# Patient Record
Sex: Female | Born: 1965 | Race: White | Hispanic: No | Marital: Married | Smoking: Never smoker
Health system: Southern US, Academic
[De-identification: ages and names within clinical notes are randomized; demographics above are authoritative.]

## PROBLEM LIST (undated history)

## (undated) DIAGNOSIS — I10 Essential (primary) hypertension: Secondary | ICD-10-CM

## (undated) HISTORY — PX: HX TONSILLECTOMY: SHX27

## (undated) HISTORY — DX: Essential (primary) hypertension: I10

## (undated) HISTORY — PX: HX DILATION AND CURETTAGE: SHX78

---

## 1999-05-28 ENCOUNTER — Observation Stay (HOSPITAL_COMMUNITY): Payer: Self-pay

## 2015-06-11 IMAGING — MG MAMMO DIGITAL SCREENING
1 series · 7 of 7 positions shown · non-contrast
Comparison: None.

------------- REPORT [id number] -------------
HALVORSON, GIRISH

MAMMO DIGITAL SCREENING WITH CAD
Exam:  
Screening digital mammogram with CAD
INDICATION: Annual screening.

[R CC · oblique · right · 7 of 7 slices shown]
[im 1/7]
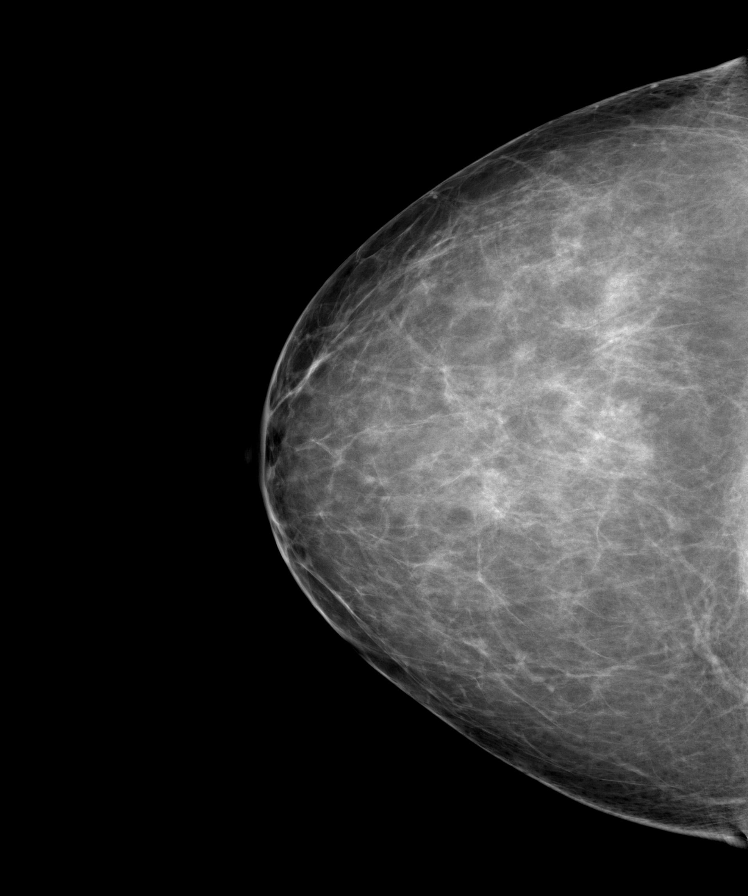
[im 2/7]
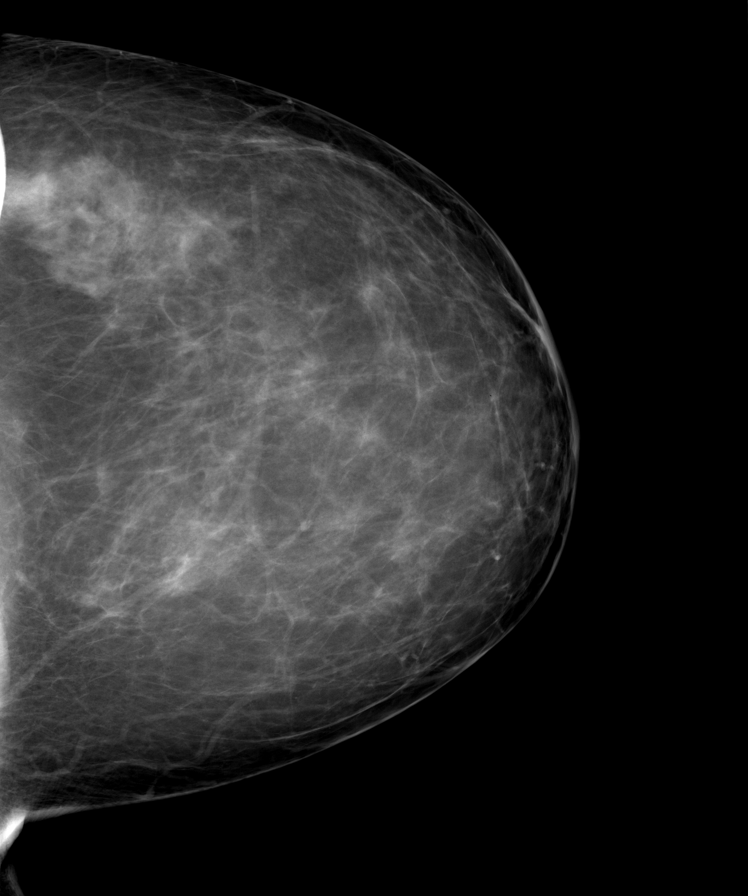
[im 3/7]
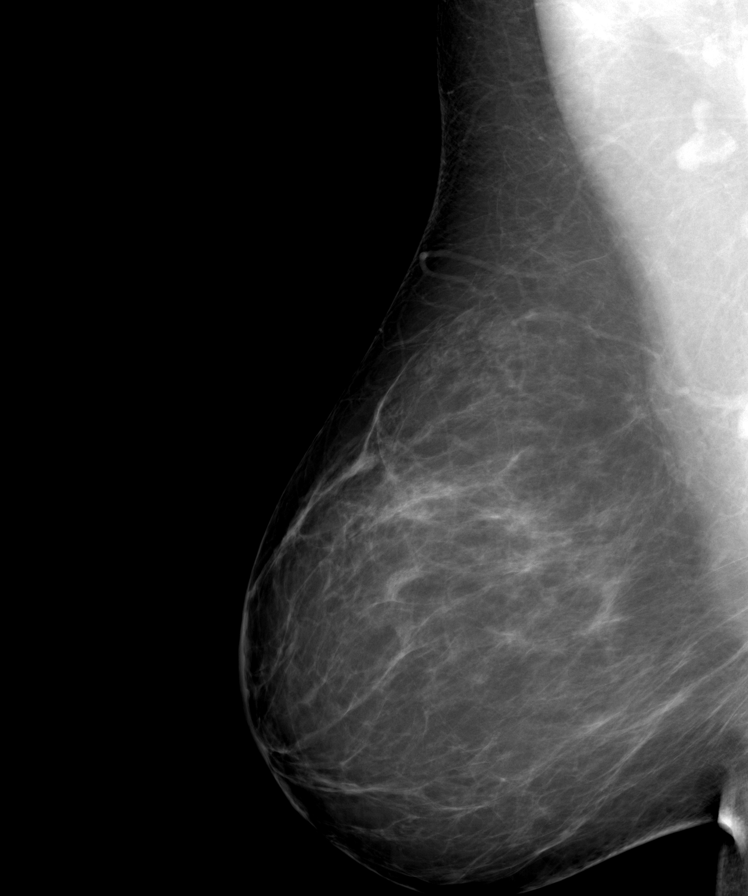
[im 4/7]
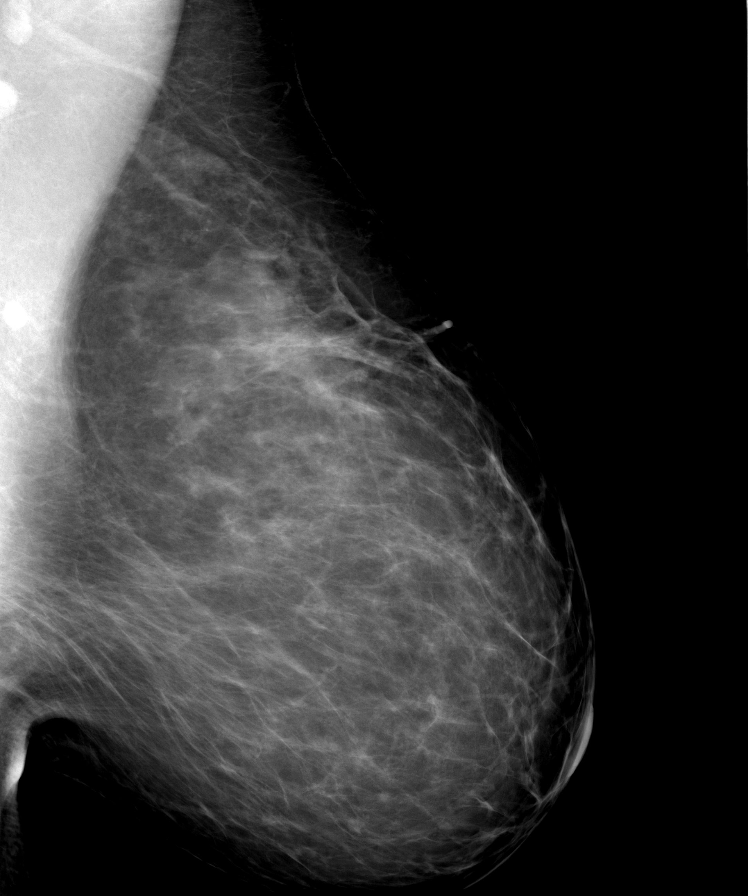
[im 5/7]
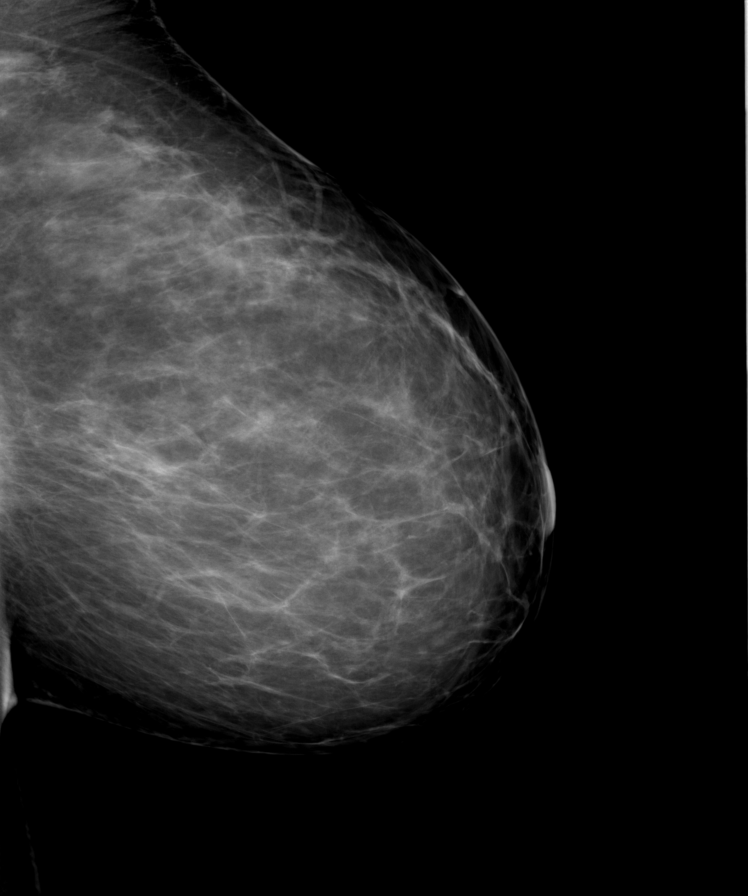
[im 6/7]
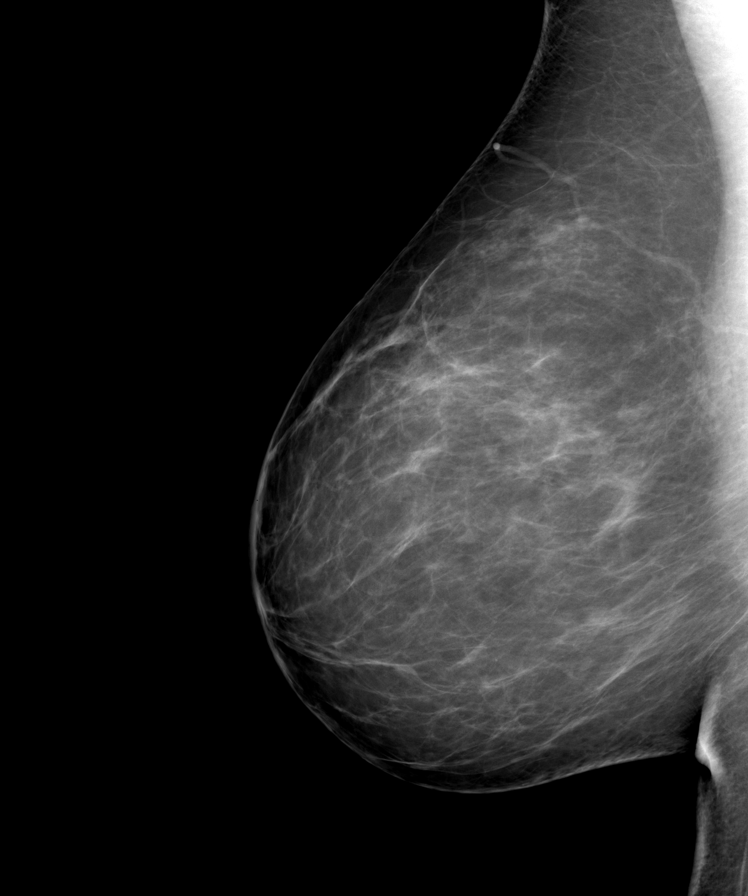
[im 7/7]
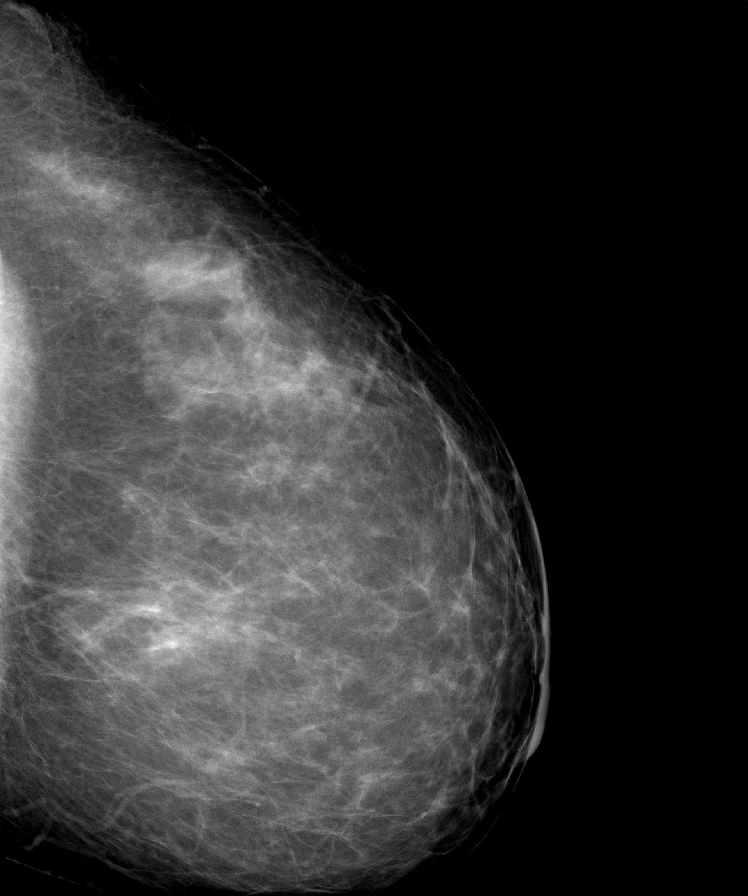

[7 of 7 positions shown; findings below may reference images not displayed]

FINDINGS: There are scattered fibroglandular elements. There is no mass or suspicious cluster of microcalcifications. There is no architectural distortion, skin thickening or nipple retraction.
IMPRESSION: 1.
BIRADS 2-Benign findings. Patient has been added in a reminder system with a
target date for the next screening mammography.
2.
DENSITY CODE   B (Scattered areas of fibroglandular density)
Final Assessment Code:
Bi-Rads 2 

BI-RADS 0
Need additional imaging evaluation
BI-RADS 1
Negative mammogram
BI-RADS 2
Benign finding
BI-RADS 3
Probably benign finding: short-interval follow-up suggested
BI-RADS 4
Suspicious abnormality:  biopsy should be considered
BI-RADS 5
Highly suggestive of malignancy; appropriate action should be taken
BI-RADS 6
Known Biopsy-proven Malignancy  Appropriate action should be taken
NOTE:
In compliance with Federal regulations, the results of this mammogram are being sent to the patient.

------------- REPORT [id number] -------------
[facility]
[address]
Daina Ms.WATANABE, PAXTON:
We wish to report the following on your recent mammography examination. We are sending a report to your referring physician or other health care provider. 
(       Normal/Negative:
No evidence of cancer.
This statement is mandated by the [place], Department of Health.
Your examination was performed by one of our technologists, who are registered radiological technologists and also specially certified in mammography:
___
Parlak, Edaly (M)
___
Dang, Mcalex (M)

Your mammogram was interpreted by our radiologist.

( 
Sofeine Made, M.D.

(Annual Breast Examination by a physician or other health care provider
(Annual Mammography Screening beginning at age 40
(Monthly Breast Self Examination

## 2018-06-27 IMAGING — MG 3D SCREENING MAMMO BIL W/CAD
5 series · 7 of 24 positions shown · non-contrast
Comparison: 02/02/2017

------------- REPORT [id number] -------------
[facility]
[address]
Daina Ms.PARAGH, MIKKIE:
We wish to report the following on your recent mammography examination. We are sending a report to your referring physician or other health care provider. 
(       Normal/Negative:
No evidence of cancer.
This statement is mandated by the [place], Department of Health.
Your examination was performed by one of our technologists, who are registered radiological technologists and also specially certified in mammography:
___
Parlak, Edaly (M)
Dang, Mcalex (M)

Your mammogram was interpreted by our radiologist.
( 
Sofeine Made, M.D.
(Annual Breast Examination by a physician or other health care provider
(Annual Mammography Screening beginning at age 40
(Monthly Breast Self Examination
------------- REPORT [id number] -------------
SURDAL, ODDVAR
EXAM:  3D BILATERAL ANNUAL SCREENING DIGITAL MAMMOGRAM WITH TOMOSYNTHESIS AND CAD
INDICATION: Screening.

[R CC · right · 0.10mm/px · 2 of 2 slices shown]
[im 1/2]
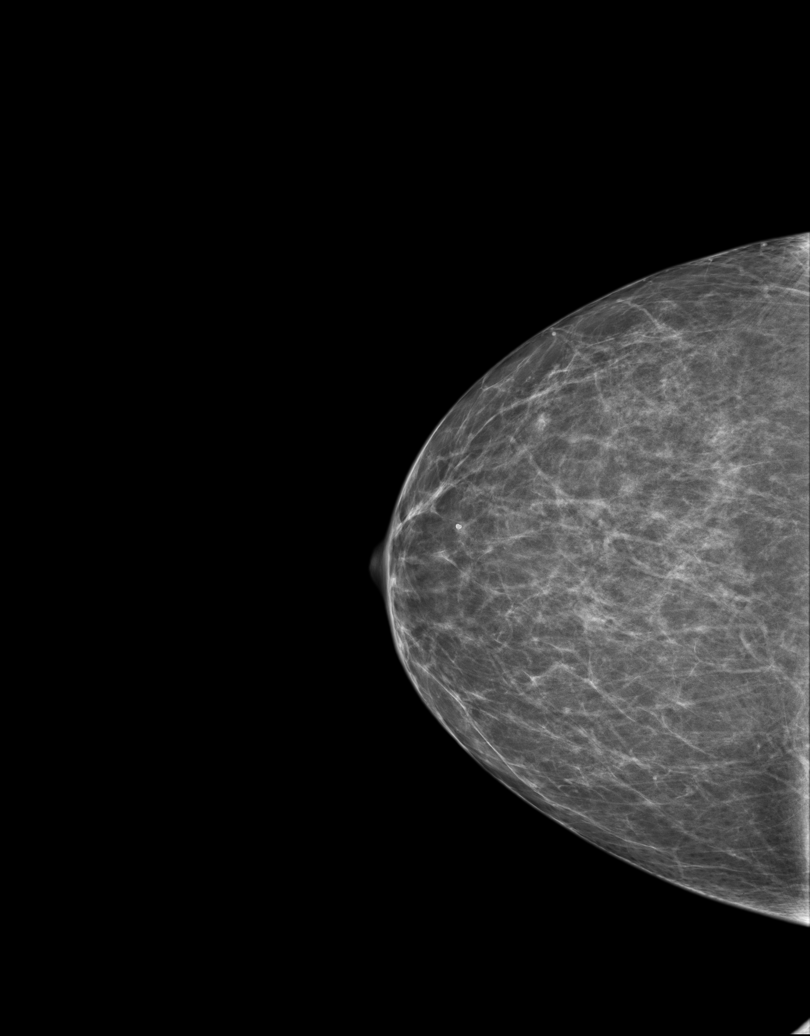
[im 2/2]
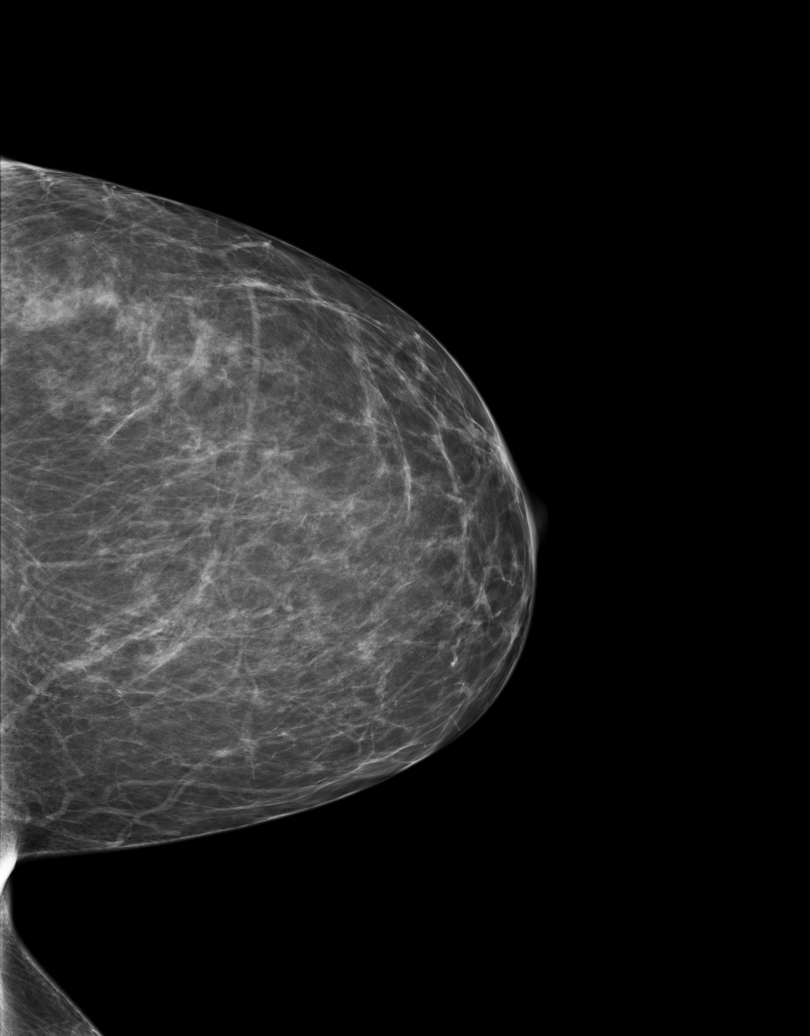

[3D SCREENING MAMMO BIL W/CAD · 2 acquisitions, 2 frames shown (1 of 2)]
[im 1/2]
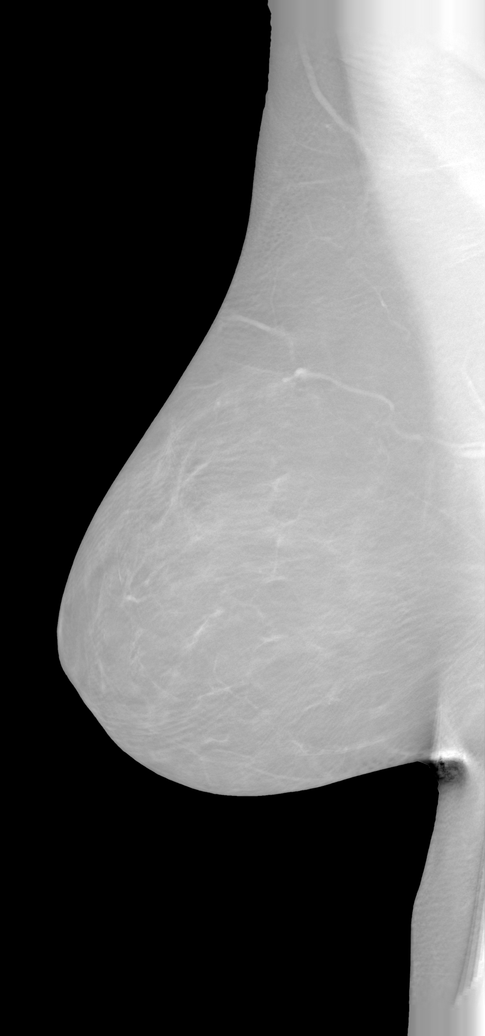
[im 2/2]
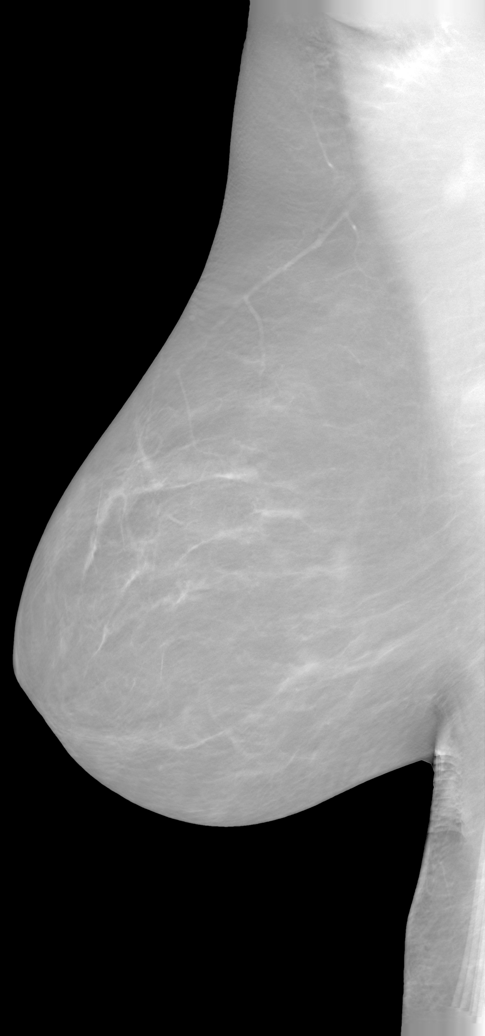

[R]
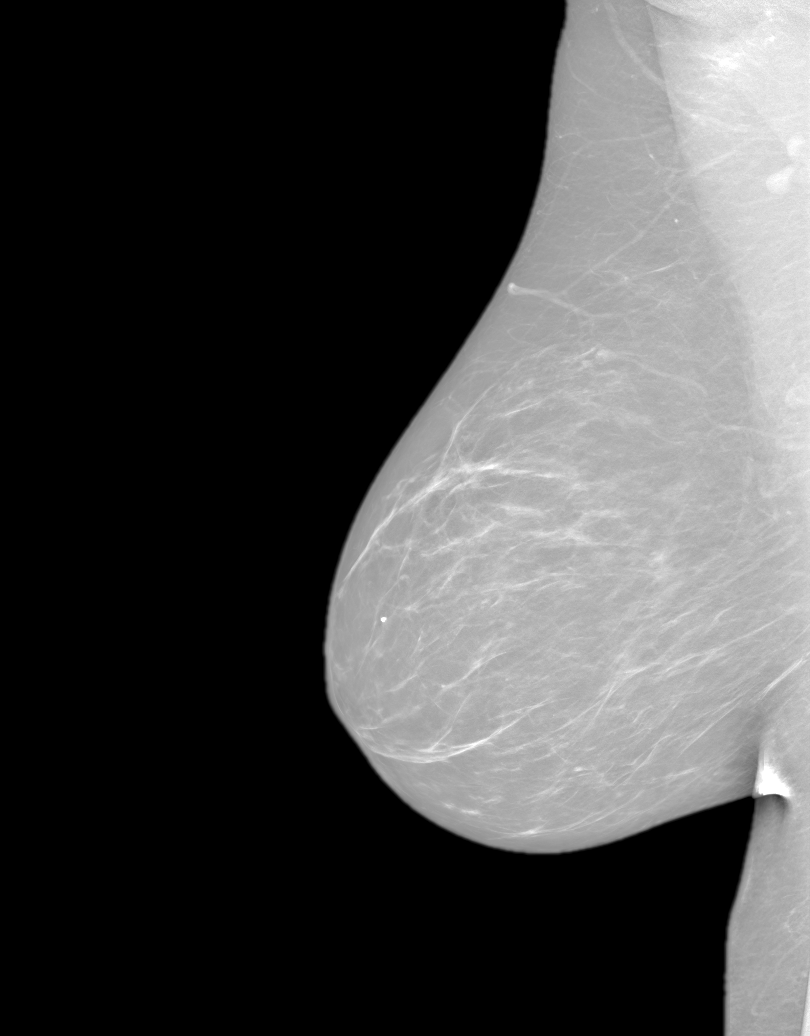

[3D SCREENING MAMMO BIL W/CAD (2 of 2) · tomo slice 14/87.0]
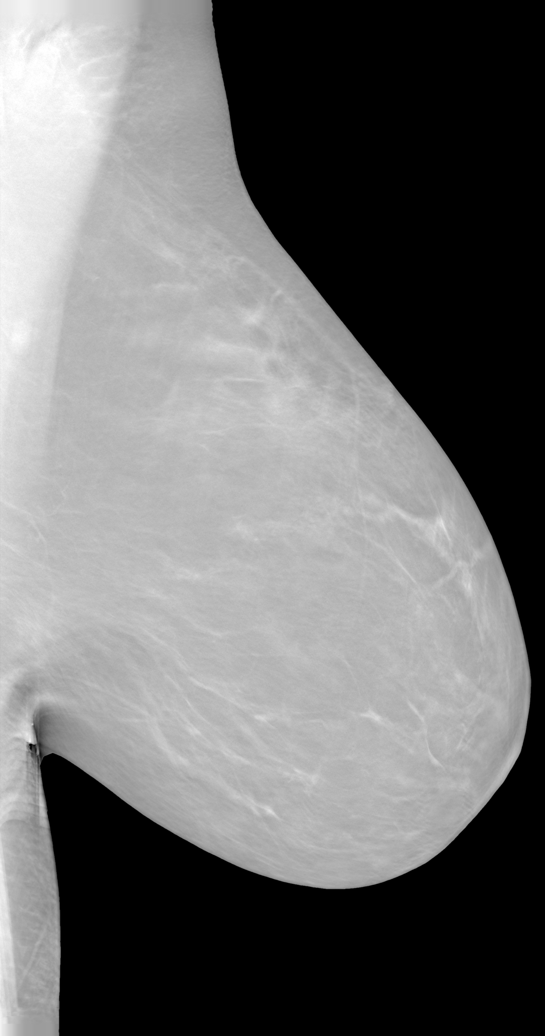

[L]
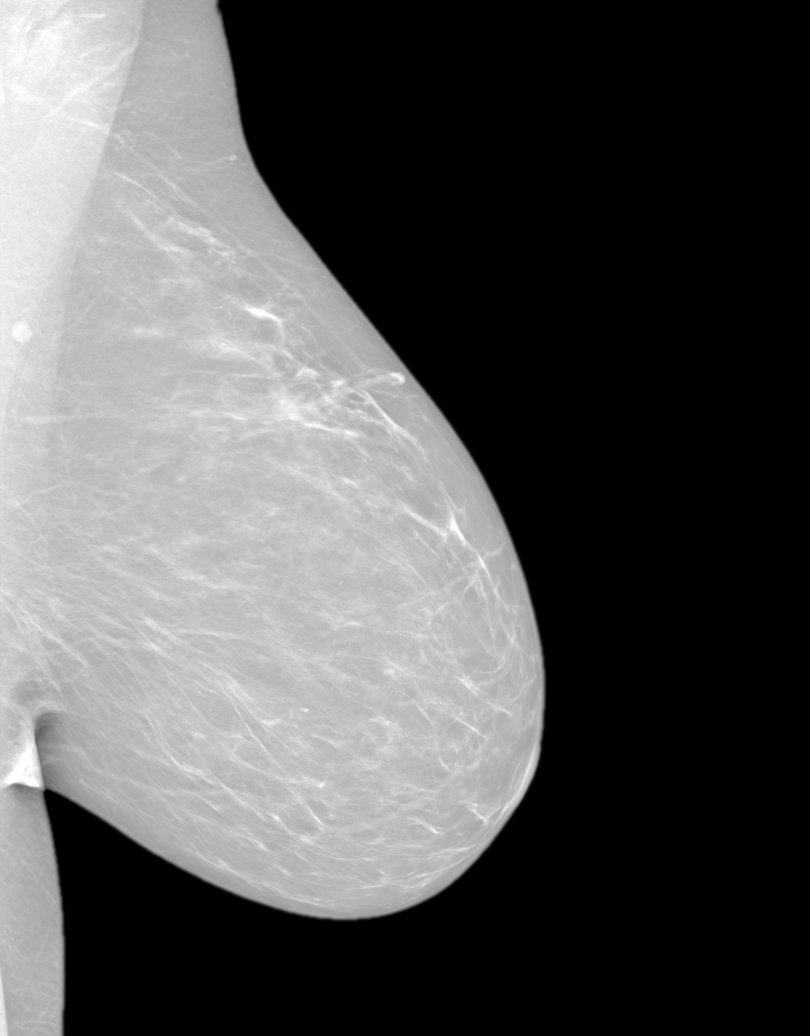

[7 of 24 positions shown; findings below may reference images not displayed]

FINDINGS: There are scattered fibroglandular elements.  There is no mass or suspicious cluster of microcalcifications.   There is no architectural distortion, skin thickening or nipple retraction.
IMPRESSION: 1.  BIRADS 2-Benign findings. Patient has been added in a reminder system with a target date for the next screening mammography.

2.  DENSITY CODE –  B (Scattered areas of fibroglandular density).

Final Assessment Code:

Bi-Rads 2 

BI-RADS 0
Need additional imaging evaluation

BI-RADS 1
Negative mammogram

BI-RADS 2
Benign finding

BI-RADS 3
Probably benign finding: short-interval follow-up suggested

BI-RADS 4
Suspicious abnormality:  biopsy should be considered

BI-RADS 5
Highly suggestive of malignancy; appropriate action should be taken

BI-RADS 6
Known Biopsy-proven Malignancy – Appropriate action should be taken

NOTE:
In compliance with Federal regulations, the results of this mammogram are being sent to the patient.

## 2020-04-29 IMAGING — MG 3D SCREENING MAMMO BIL W/CAD & TOMO
5 series · 8 of 24 positions shown · non-contrast
Comparison: 02/17/2019

------------- REPORT [id number] -------------
[facility]
[address]
Daina Ms.PARAGH, MIKKIE:
We wish to report the following on your recent mammography examination. We are sending a report to your referring physician or other health care provider. 
(       Normal/Negative:
No evidence of cancer.
This statement is mandated by the [place], Department of Health.
Your examination was performed by one of our technologists, who are registered radiological technologists and also specially certified in mammography:
___
Parlak, Edaly (M)
Nepomuceno, Martinez (M)

Your mammogram was interpreted by our radiologist.
( 
Sofeine Made, M.D.
(Annual Breast Examination by a physician or other health care provider
(Annual Mammography Screening beginning at age 40
(Monthly Breast Self Examination
------------- REPORT [id number] -------------
﻿
                         #:
EXAM:  3D BILATERAL ANNUAL SCREENING DIGITAL MAMMOGRAM WITH CAD AND TOMOSYNTHESIS
INDICATION: Screening.

[R]
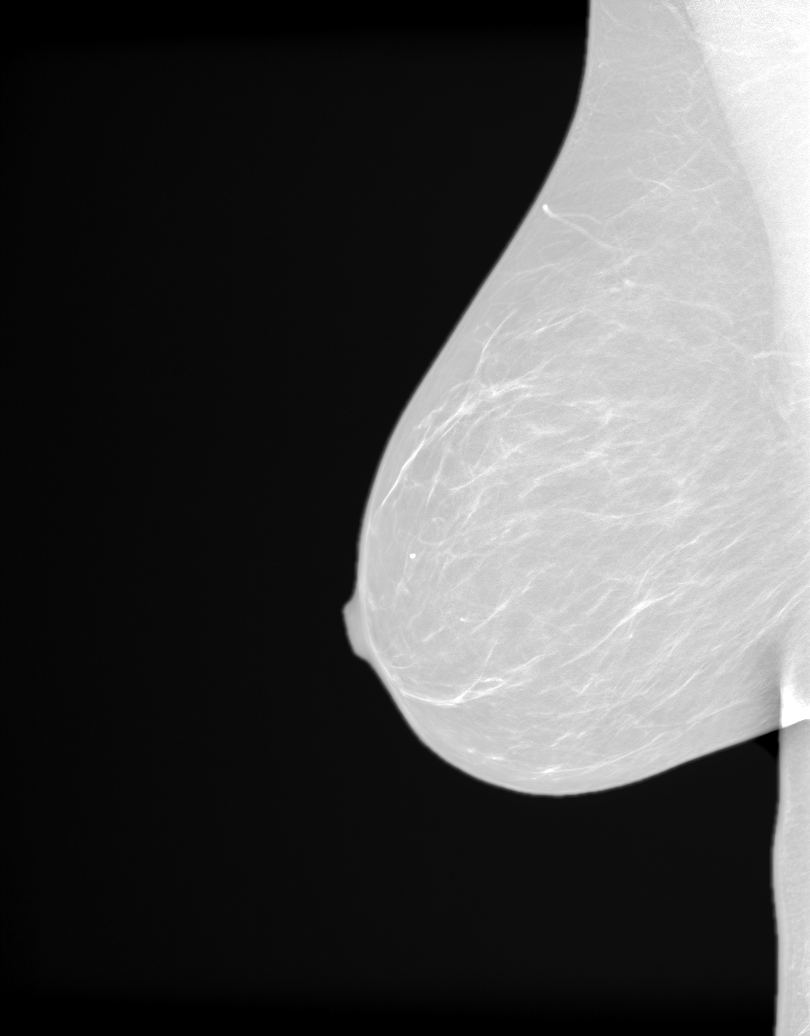

[L]
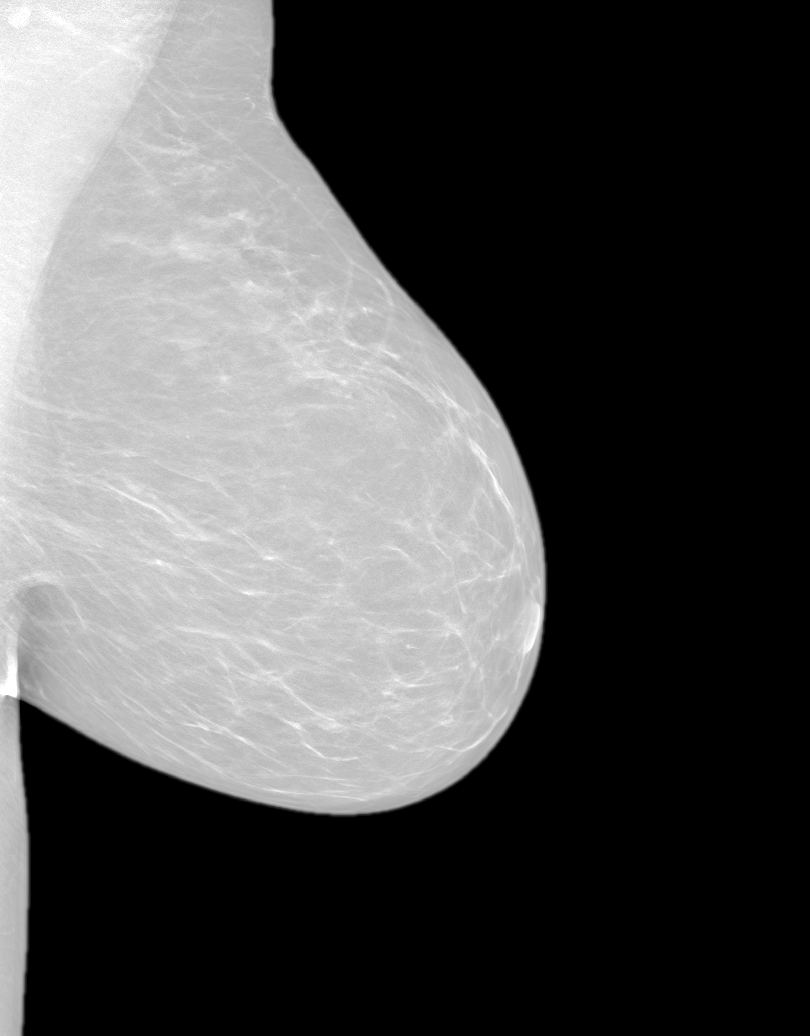

[R CC tomo · right · 2 of 2 slices shown]
[im 1/2]
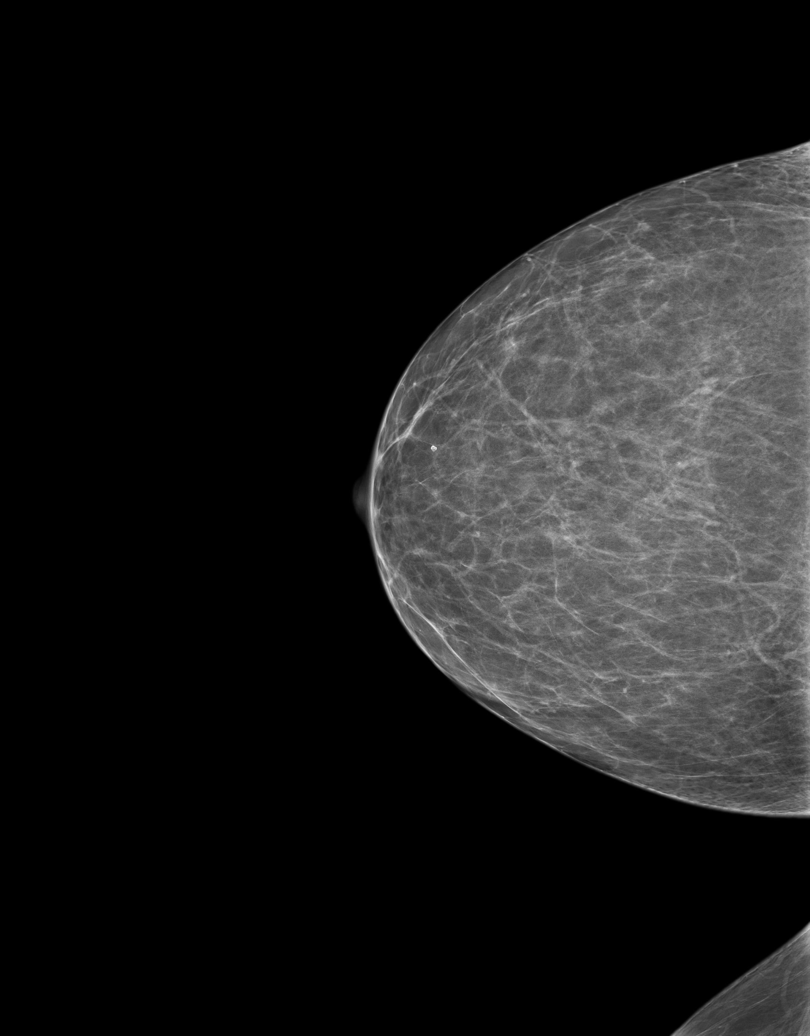
[im 2/2]
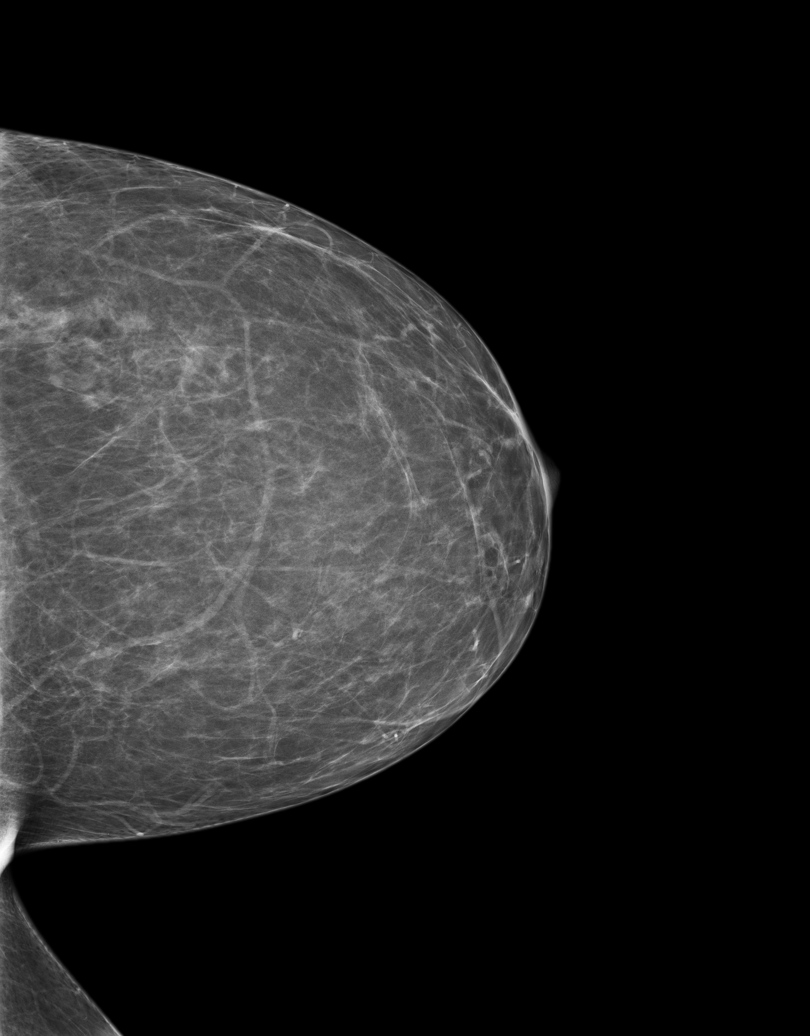

[3D SCREENING MAMMO BIL W/CAD & TOMO tomo · 2 acquisitions, 3 frames shown (1 of 2)]
[im 1/2]
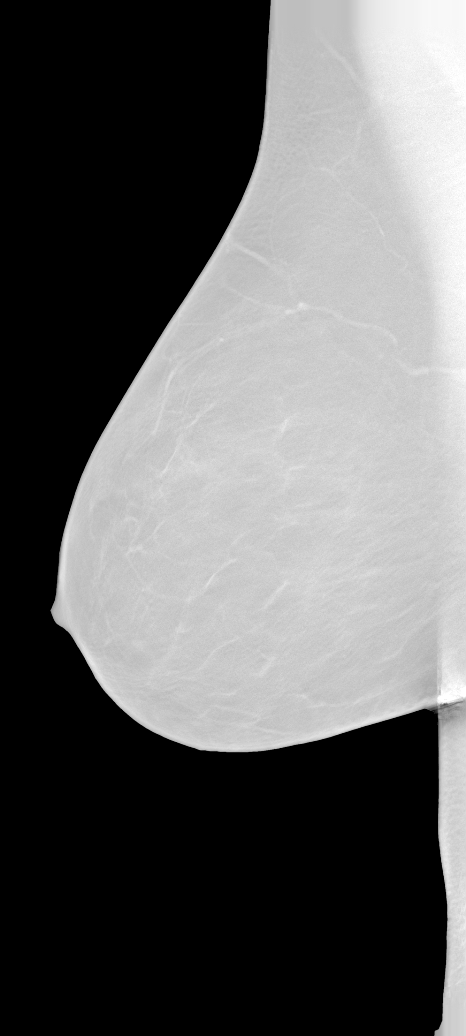
[im 2/2]
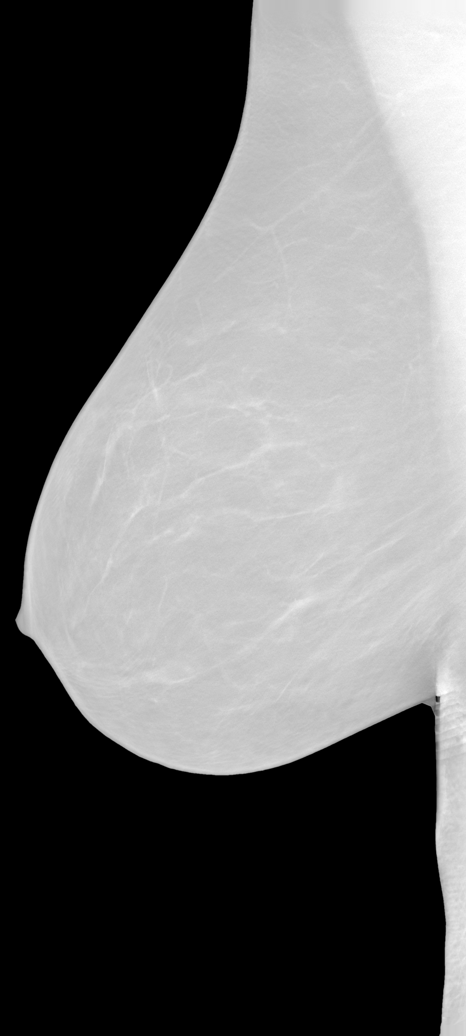
[im 2/2]
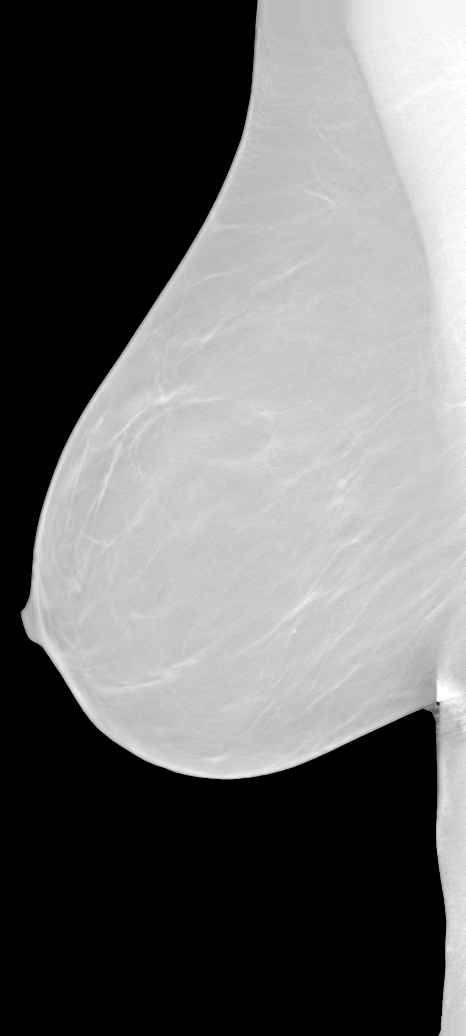

[3D SCREENING MAMMO BIL W/CAD & TOMO tomo (2 of 2) · tomo slice 13/78.0]
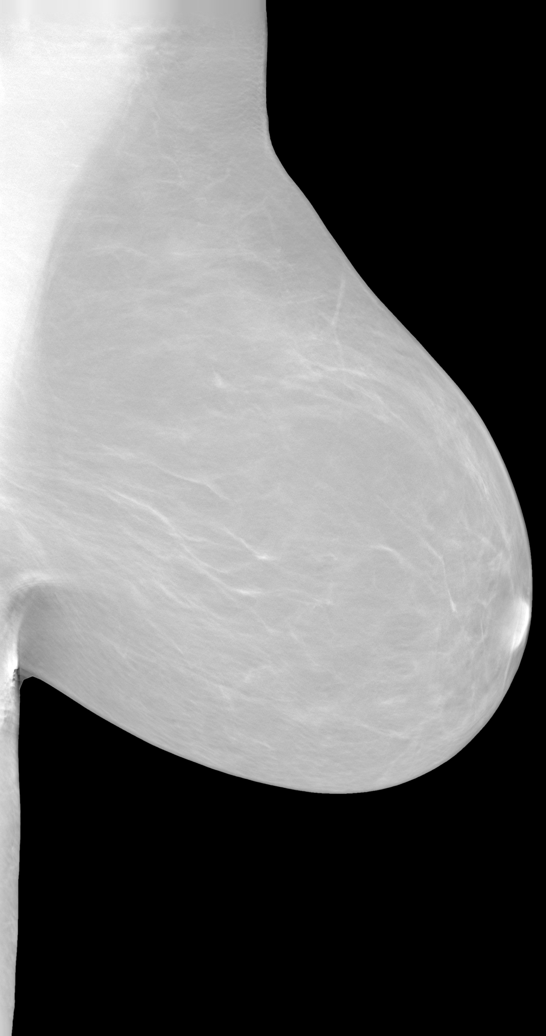

[8 of 24 positions shown; findings below may reference images not displayed]

FINDINGS: There are scattered fibroglandular elements.  There is no mass or suspicious cluster of microcalcifications.   There is no architectural distortion, skin thickening or nipple retraction.
IMPRESSION: 1.  BIRADS 2-Benign findings. Patient has been added in a reminder system with a target date for the next screening mammography.

2.  DENSITY CODE – B (Scattered areas of fibroglandular density). 

Final Assessment Code:

Bi-Rads 2 

BI-RADS 0
Need additional imaging evaluation.

BI-RADS 1
Negative mammogram.

BI-RADS 2
Benign finding.

BI-RADS 3
Probably benign finding; short-interval follow-up suggested.

BI-RADS 4
Suspicious abnormality; biopsy should be considered.

BI-RADS 5
Highly suggestive of malignancy; appropriate action should be taken.

BI-RADS 6
Known biopsy-proven malignancy; appropriate action should be taken.

NOTE:
In compliance with Federal regulations, the results of this mammogram are being sent to the patient.

## 2021-08-04 IMAGING — MG 3D SCREENING MAMMO BIL AND TOMO
5 series · 7 of 24 positions shown · non-contrast
Comparison: Exam dated 08/08/2021, 10/06/2019.

------------- REPORT [id number] -------------
[facility]
[address]
Tiger Ms.FOUSSEYNI, CRIZNA:
We wish to report the following on your recent mammography examination. We are sending a report to your referring physician or other health care provider. 
(       Normal/Negative:
No evidence of cancer.
This statement is mandated by the [place], Department of Health.
Your examination was performed by one of our technologists, who are registered radiological technologists and also specially certified in mammography:
___
Markland, Marjuan (M)

Your mammogram was interpreted by our radiologist.
( 
Collette Sedman, M.D.
(Annual Breast Examination by a physician or other health care provider
(Annual Mammography Screening beginning at age 40
(Monthly Breast Self Examination
------------- REPORT [id number] -------------
﻿
ADDOW, TOM-ARILD
EXAM:  BILATERAL DIGITAL SCREENING MAMMOGRAM WITH 3D TOMOSYNTHESIS
INDICATION: Asymptomatic 55-year-old with family history of breast cancer in her mother.  Lifetime breast cancer risk 17%.

[R]
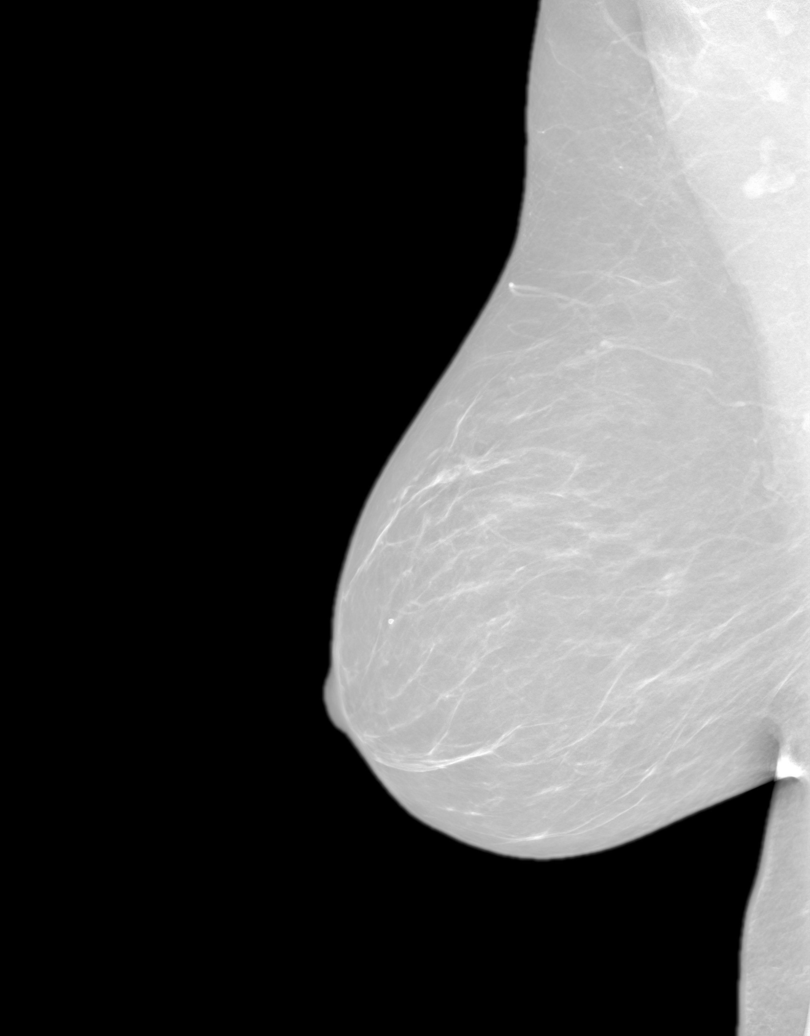

[L]
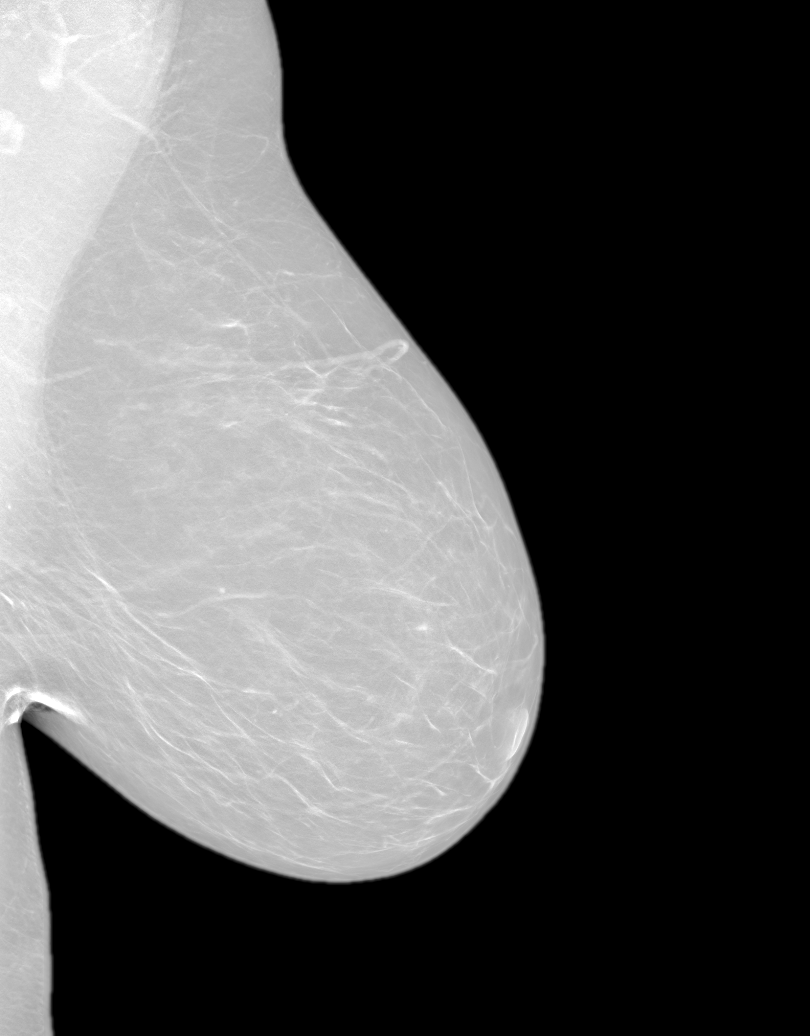

[R CC tomo · right · 0.10mm/px · 2 of 2 slices shown]
[im 1/2]
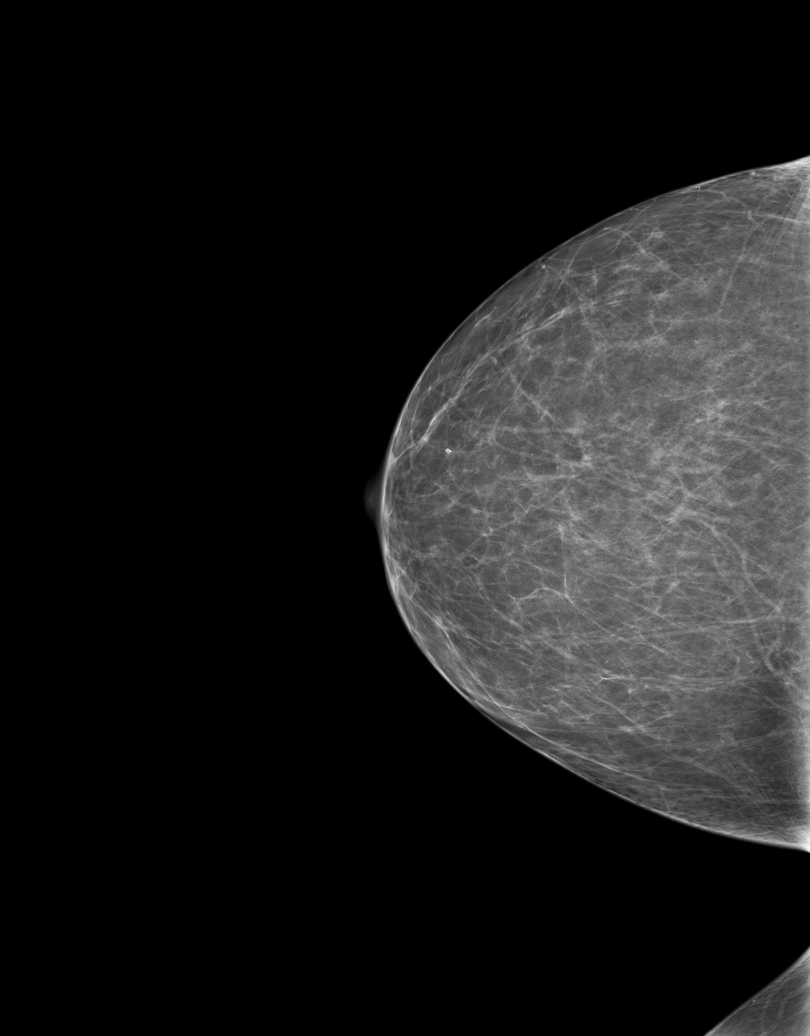
[im 2/2]
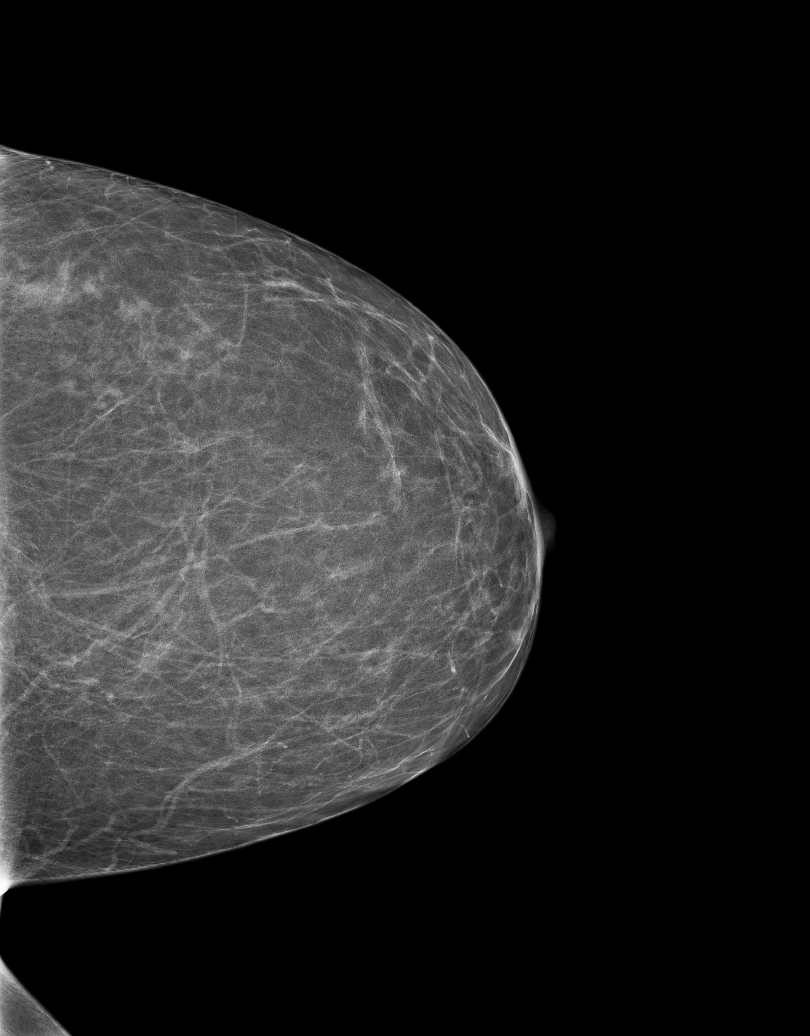

[3D SCREENING MAMMO BIL AND TOMO tomo · 2 acquisitions, 2 frames shown (1 of 2)]
[im 1/2]
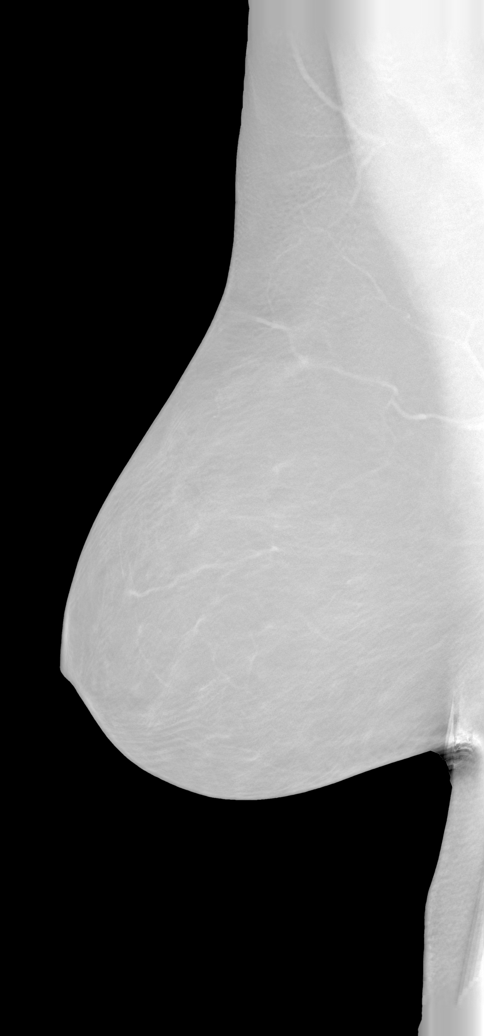
[im 2/2]
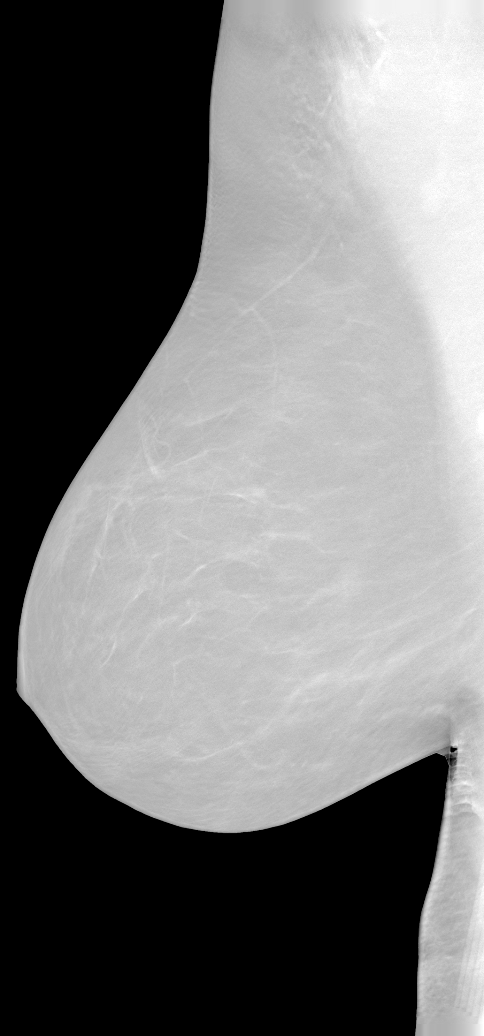

[3D SCREENING MAMMO BIL AND TOMO tomo (2 of 2) · tomo slice 14/85.0]
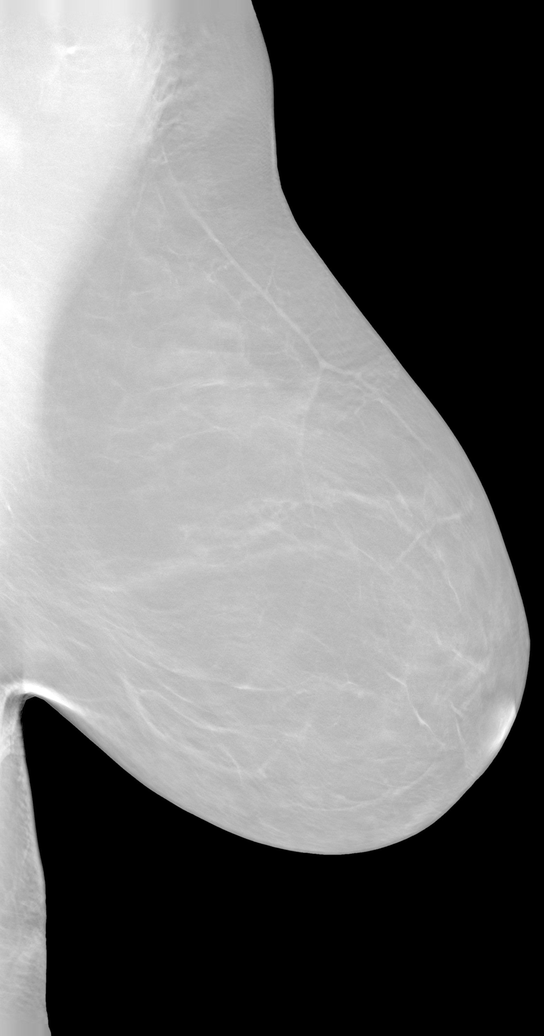

[7 of 24 positions shown; findings below may reference images not displayed]

FINDINGS: No focal mass or architectural changes are noted.  No evidence of abnormal calcific densities, skin changes, nipple changes or duct dilation are seen.  Minimal asymmetry of breast tissue in the lateral posterior left breast is stable.
IMPRESSION: 1.  Stable mammographic findings.  Clinical and mammographic follow-up at 12 months. 

2.  BIRADS 2-Benign findings. Patient has been added in a reminder system with a target date for the next screening mammography.

3.  DENSITY CODE – B (Scattered areas of fibroglandular density). 

Final Assessment Code:

Bi-Rads 2 

BI-RADS 0
 Need additional imaging evaluation.

BI-RADS 1
 Negative mammogram.

BI-RADS 2
 Benign finding.

BI-RADS 3
 Probably benign finding; short-interval follow-up suggested.

BI-RADS 4
 Suspicious abnormality; biopsy should be considered.

BI-RADS 5
 Highly suggestive of malignancy; appropriate action should be taken.

BI-RADS 6
 Known biopsy-proven malignancy; appropriate action should be taken.

NOTE:
In compliance with Federal regulations, the results of this mammogram are being sent to the patient.

## 2022-05-26 ENCOUNTER — Telehealth (INDEPENDENT_AMBULATORY_CARE_PROVIDER_SITE_OTHER): Payer: Self-pay | Admitting: OBSTETRICS/GYNECOLOGY

## 2022-05-26 ENCOUNTER — Other Ambulatory Visit (INDEPENDENT_AMBULATORY_CARE_PROVIDER_SITE_OTHER): Payer: Self-pay | Admitting: OBSTETRICS/GYNECOLOGY

## 2022-05-26 MED ORDER — BUPROPION HCL XL 300 MG 24 HR TABLET, EXTENDED RELEASE
300.0000 mg | ORAL_TABLET | Freq: Every day | ORAL | 11 refills | Status: DC
Start: 2022-05-26 — End: 2024-02-08

## 2022-05-26 MED ORDER — ESTRADIOL 1 MG TABLET
1.0000 mg | ORAL_TABLET | Freq: Every day | ORAL | 11 refills | Status: DC
Start: 2022-05-26 — End: 2022-06-16

## 2022-06-10 ENCOUNTER — Encounter (INDEPENDENT_AMBULATORY_CARE_PROVIDER_SITE_OTHER): Payer: Self-pay | Admitting: NURSE PRACTITIONER

## 2022-06-10 ENCOUNTER — Ambulatory Visit (INDEPENDENT_AMBULATORY_CARE_PROVIDER_SITE_OTHER): Admitting: NURSE PRACTITIONER

## 2022-06-10 ENCOUNTER — Other Ambulatory Visit: Payer: Self-pay

## 2022-06-10 VITALS — Ht 66.0 in | Wt 165.0 lb

## 2022-06-10 DIAGNOSIS — H9201 Otalgia, right ear: Secondary | ICD-10-CM

## 2022-06-10 DIAGNOSIS — H6241 Otitis externa in other diseases classified elsewhere, right ear: Secondary | ICD-10-CM

## 2022-06-10 DIAGNOSIS — B369 Superficial mycosis, unspecified: Secondary | ICD-10-CM

## 2022-06-10 MED ORDER — CLOTRIMAZOLE-BETAMETHASONE 1 %-0.05 % LOTION
TOPICAL_LOTION | Freq: Two times a day (BID) | CUTANEOUS | 0 refills | Status: AC
Start: 2022-06-10 — End: ?

## 2022-06-10 NOTE — H&P (Signed)
ENT, [facility]  [address]  [place] [address]  Phone: [phone]  Fax: [phone]      Encounter Date: 06/10/2022    Patient ID: Leah Mendez  MRN: [id number]    DOB: [birth year]  Age: 56 y.o. female         Referring Provider:    No referring provider defined for this encounter.    Reason for Visit:   Chief Complaint   Patient presents with    Ear Problem(s)     Patient complains of bil otorrhea, otalgia, hearing loss R>L. States bil ear swell. 3 rounds of abx        History of Present Illness:  Leah Mendez is a 56 y.o. female referred for otalgia. She complains of of getting water in ear in July and having recurrent otalgia and ear fullness every time she gets water in her ear.  Treated with antibiotics and ear drops.      Patient History:  There is no problem list on file for this patient.    Current Outpatient Medications   Medication Sig    buPROPion (WELLBUTRIN XL) 300 mg extended release 24 hr tablet Take 1 Tablet (300 mg total) by mouth Once a day    clotrimazole-betamethasone (LOTRISONE) 1-0.05 % Lotion Apply topically Twice daily    estradioL (ESTRACE) 1 mg Oral Tablet Take 1 Tablet (1 mg total) by mouth Once a day    losartan (COZAAR) 25 mg Oral Tablet Take 1 Tablet (25 mg total) by mouth Once a day    progesterone micronized (PROMETRIUM) 100 mg Oral Capsule Take 1 Capsule (100 mg total) by mouth Once a day    simvastatin (ZOCOR) 20 mg Oral Tablet Take 1 Tablet (20 mg total) by mouth Once a day     No Known Allergies  Past Medical History:   Diagnosis Date    Essential hypertension       Past Surgical History:   Procedure Laterality Date    HX TONSILLECTOMY        Family Medical History:       Problem Relation (Age of Onset)    Hypertension (High Blood Pressure) Other    Sleep apnea Other            Social History     Tobacco Use    Smoking status: Never    Smokeless tobacco: Never   Substance Use Topics    Alcohol use: Never    Drug use: Never       Review of Systems     Vitals:     06/10/22 1412   Weight: 74.8 kg (165 lb)   Height: 1.676 m (5\' 6" )   BMI: 26.69      ENT Physical Exam  Constitutional  Appearance: patient appears well-developed, well-nourished and well-groomed,  Communication/Voice: communication appropriate for developmental age; vocal quality normal;  Head and Face  Appearance: head appears normal, face appears normal and face appears atraumatic;  Palpation: facial palpation normal;  Salivary: glands normal;  Ear  Hearing: intact;  Auricles: right auricle normal; left auricle normal;  External Mastoids: right external mastoid normal; left external mastoid normal;  Ear Canals: left ear canal normal;  Tympanic Membranes: right tympanic membrane normal; left tympanic membrane normal;  Ear comments: Fungal debris right ear canal and mild erythema to canal  Lymphatic  Palpation: no cervical adenopathy noted;  Neurovestibular  Mental Status: alert and oriented;  Psychiatric: mood normal; affect is appropriate;  Assessment:  ENCOUNTER DIAGNOSES     ICD-10-CM   1. Otitis externa, fungal, right ear  B36.9    H62.41   2. Right ear pain  H92.01       Plan:  Medical records reviewed on 06/10/2022.  Suctioned fungal otorrhea from right ear canal  Lotrisone lotion right ear BID until follow up  Dry ear precautions right ear.    Orders Placed This Encounter    clotrimazole-betamethasone (LOTRISONE) 1-0.05 % Lotion     Return for 2-3 weeks.    Elnora Morrison, FNP-BC  06/10/2022, 14:23

## 2022-06-16 ENCOUNTER — Other Ambulatory Visit: Payer: Self-pay

## 2022-06-16 ENCOUNTER — Encounter (INDEPENDENT_AMBULATORY_CARE_PROVIDER_SITE_OTHER): Payer: Self-pay | Admitting: OBSTETRICS/GYNECOLOGY

## 2022-06-16 ENCOUNTER — Other Ambulatory Visit: Attending: OBSTETRICS/GYNECOLOGY | Admitting: OBSTETRICS/GYNECOLOGY

## 2022-06-16 ENCOUNTER — Ambulatory Visit (INDEPENDENT_AMBULATORY_CARE_PROVIDER_SITE_OTHER): Admitting: OBSTETRICS/GYNECOLOGY

## 2022-06-16 VITALS — BP 150/80 | HR 77 | Ht 66.0 in | Wt 160.0 lb

## 2022-06-16 DIAGNOSIS — N898 Other specified noninflammatory disorders of vagina: Secondary | ICD-10-CM

## 2022-06-16 DIAGNOSIS — Z01419 Encounter for gynecological examination (general) (routine) without abnormal findings: Secondary | ICD-10-CM | POA: Insufficient documentation

## 2022-06-16 DIAGNOSIS — Z1231 Encounter for screening mammogram for malignant neoplasm of breast: Secondary | ICD-10-CM

## 2022-06-16 DIAGNOSIS — Z79890 Hormone replacement therapy (postmenopausal): Secondary | ICD-10-CM

## 2022-06-16 DIAGNOSIS — Z01411 Encounter for gynecological examination (general) (routine) with abnormal findings: Secondary | ICD-10-CM

## 2022-06-16 MED ORDER — ESTRADIOL 1 MG TABLET
1.0000 mg | ORAL_TABLET | Freq: Every day | ORAL | 11 refills | Status: AC
Start: 2022-06-16 — End: ?

## 2022-06-16 MED ORDER — PROGESTERONE MICRONIZED 100 MG CAPSULE
100.0000 mg | ORAL_CAPSULE | Freq: Every day | ORAL | 11 refills | Status: AC
Start: 2022-06-16 — End: ?

## 2022-06-16 NOTE — Progress Notes (Signed)
[facility], [facility]  [address] [place] [address]       Name: Leah Mendez MRN:  [id number]   Date: 06/16/2022 Age: 55 y.o.         Leah Mendez is a 56 y.o. female who presents for an annual exam.       Nursing Notes:   Clovis Fredrickson, LPN  56/43/32 [id number]  Signed  Annual exam  No complaints, she does need refills on her Estrace 1mg , and Prometrium100mg , as well as a referral for yearly mammogram  Summer Price, LPN       No Known Allergies  Outpatient Medications Marked as Taking for the 06/16/22 encounter (Procedure visit) with 14/6/23, DO   Medication Sig    buPROPion (WELLBUTRIN XL) 300 mg extended release 24 hr tablet Take 1 Tablet (300 mg total) by mouth Once a day    clotrimazole-betamethasone (LOTRISONE) 1-0.05 % Lotion Apply topically Twice daily    esomeprazole magnesium (NEXIUM) 20 mg Oral Capsule, Delayed Release(E.C.) Take 1 Capsule (20 mg total) by mouth Every morning before breakfast    estradioL (ESTRACE) 1 mg Oral Tablet Take 1 Tablet (1 mg total) by mouth Once a day    losartan (COZAAR) 25 mg Oral Tablet Take 1 Tablet (25 mg total) by mouth Once a day    progesterone micronized (PROMETRIUM) 100 mg Oral Capsule Take 1 Capsule (100 mg total) by mouth Once a day    simvastatin (ZOCOR) 20 mg Oral Tablet Take 1 Tablet (20 mg total) by mouth Once a day        Medical, Surgical and Family Hx Reviewed today and is contained within the electronic medical record.    01-03-2002  Social History     Socioeconomic History    Marital status: Married   Tobacco Use    Smoking status: Never    Smokeless tobacco: Never   Substance and Sexual Activity    Alcohol use: Never    Drug use: Never    Sexual activity: Not Currently       Physical Exam:     Weight: 72.6 kg (160 lb)  Height: 167.6 cm (5\' 6" )  BP (Non-Invasive): (!) 150/80  Heart Rate: 77  Body mass index is 25.82 kg/m.  OBGyn Exam     Physical Exam:   Well-developed well-nourished white female in no acute distress  HEENT grossly  normal  CV regular rate and rhythm no murmurs heard  Lungs clear to auscultation bilaterally  Breasts symmetric without lesions  Abdomen soft and nontender  Perineum good support and vulva appears normal  Vagina homogeneous vaginal discharge  Cervix parous Pap done  Uterus small mobile  Adnexa no tenderness or masses.      Assessment & Plan       ICD-10-CM    1. Encounter for well woman exam with routine gynecological exam  Z01.419 THINPREP IMAG PAP AND HPV MRNA E6/E7 REF HPV 16,18/45 ([facility])      2. Screening mammogram, encounter for  Z12.31 Referral to External Provider      3. Hormone replacement therapy  Z79.890       4. Vaginal discharge  N89.8            Pap smears performed patient instruction monthly self-breast exams breast exams ordered routine mammogram wet prep obtained continue hormone replacement follow up 1 year   Return in 1 year (on 06/17/2023) for GYN Annual exam.     Estrace 1 mg daily  Prometrium 100 daily  Conley Rolls, DO  06/16/2022 15:37     This note was partially generated using MModal Fluency Direct system, and there may be some incorrect words, spellings, and punctuation that were not noted in checking the note before saving.

## 2022-06-16 NOTE — Nursing Note (Signed)
Annual exam  No complaints, she does need refills on her Estrace 1mg , and Prometrium100mg , as well as a referral for yearly mammogram  Jamaal Bernasconi, LPN

## 2022-06-23 LAB — THINPREP IMAG PAP AND HPV MRNA E6/E7 REF HPV 16,18/45 (QUEST): HPV MRNA E6/E7: NOT DETECTED

## 2022-06-29 ENCOUNTER — Ambulatory Visit (INDEPENDENT_AMBULATORY_CARE_PROVIDER_SITE_OTHER): Admitting: NURSE PRACTITIONER

## 2022-06-29 ENCOUNTER — Other Ambulatory Visit: Payer: Self-pay

## 2022-06-29 ENCOUNTER — Encounter (INDEPENDENT_AMBULATORY_CARE_PROVIDER_SITE_OTHER): Payer: Self-pay | Admitting: NURSE PRACTITIONER

## 2022-06-29 VITALS — Ht 66.0 in | Wt 160.0 lb

## 2022-06-29 DIAGNOSIS — B369 Superficial mycosis, unspecified: Secondary | ICD-10-CM

## 2022-06-29 DIAGNOSIS — H6241 Otitis externa in other diseases classified elsewhere, right ear: Secondary | ICD-10-CM

## 2022-06-29 NOTE — Progress Notes (Signed)
ENT, [facility]  [place] [address]  Phone: [phone]  Fax: [phone]      Encounter Date: 06/29/2022    Patient ID: Leah Mendez  MRN: [id number]    DOB: 1965/11/04  Age: 56 y.o. female     Progress Note       Referring Provider:  No ref. provider found    Reason for Visit:   Chief Complaint   Patient presents with    Ear Problem(s)     3 week rc, patient complains of hearing loss and fullness R ear        History of Present Illness:  Leah Mendez is a 56 y.o. female follow up right fungal OE.  Using Lotrisone lotion and complains of right ear fullness for 10 days      Patient History:  Patient Active Problem List   Diagnosis    Screening mammogram, encounter for    Hormone replacement therapy    Vaginal discharge     Current Outpatient Medications   Medication Sig    buPROPion (WELLBUTRIN XL) 300 mg extended release 24 hr tablet Take 1 Tablet (300 mg total) by mouth Once a day    clotrimazole-betamethasone (LOTRISONE) 1-0.05 % Lotion Apply topically Twice daily    esomeprazole magnesium (NEXIUM) 20 mg Oral Capsule, Delayed Release(E.C.) Take 1 Capsule (20 mg total) by mouth Every morning before breakfast    estradioL (ESTRACE) 1 mg Oral Tablet Take 1 Tablet (1 mg total) by mouth Once a day    levoFLOXacin (LEVAQUIN) 500 mg Oral Tablet Take 1 Tablet (500 mg total) by mouth Once a day    losartan (COZAAR) 25 mg Oral Tablet Take 1 Tablet (25 mg total) by mouth Once a day    progesterone micronized (PROMETRIUM) 100 mg Oral Capsule Take 1 Capsule (100 mg total) by mouth Once a day    simvastatin (ZOCOR) 20 mg Oral Tablet Take 1 Tablet (20 mg total) by mouth Once a day      No Known Allergies  Past Medical History:   Diagnosis Date    Essential hypertension      Past Surgical History:   Procedure Laterality Date    HX DILATION AND CURETTAGE      HX TONSILLECTOMY       Family Medical History:       Problem Relation (Age of Onset)    Hypertension (High Blood Pressure) Other    Sleep  apnea Other            Social History     Tobacco Use    Smoking status: Never    Smokeless tobacco: Never   Substance Use Topics    Alcohol use: Never    Drug use: Never       Review of Systems     Vitals:    06/29/22 1455   Weight: 72.6 kg (160 lb)   Height: 1.676 m (5\' 6" )   BMI: 25.88      ENT Physical Exam  Constitutional  Appearance: patient appears well-developed, well-nourished and well-groomed,  Communication/Voice: communication appropriate for developmental age; vocal quality normal;  Head and Face  Appearance: head appears normal, face appears normal and face appears atraumatic;  Palpation: facial palpation normal;  Salivary: glands normal;  Ear  Hearing: intact;  Auricles: right auricle normal; left auricle normal;  External Mastoids: right external mastoid normal; left external mastoid normal;  Ear Canals: right ear canal normal; left ear canal normal;  Tympanic Membranes: right tympanic membrane normal; left tympanic membrane normal;  Ear comments: Lotion blocking right ear canal  Lymphatic  Palpation: no cervical adenopathy noted;  Neurovestibular  Mental Status: alert and oriented;  Psychiatric: mood normal; affect is appropriate;       Assessment:  ENCOUNTER DIAGNOSES     ICD-10-CM   1. Otitis externa, fungal, right ear  B36.9    H62.41       Plan  Medical records reviewed on 06/29/2022.  Lotrisone blocking the right ear canal.  This was irrigated and suctioned.  Fungal OE has resolved.  Will discontinue Lotrisone.      No orders of the defined types were placed in this encounter.    Return if symptoms worsen or fail to improve.    Elnora Morrison, FNP-BC  06/29/2022, 15:10

## 2022-08-19 IMAGING — MG 3D SCREENING MAMMO BIL AND TOMO
5 series · 7 of 24 positions shown · non-contrast
Comparison: 01/07/2023

------------- REPORT [id number] -------------
﻿

EXAM:  3D SCREENING MAMMO BIL AND TOMO
INDICATION: Screening mammogram.

[L]
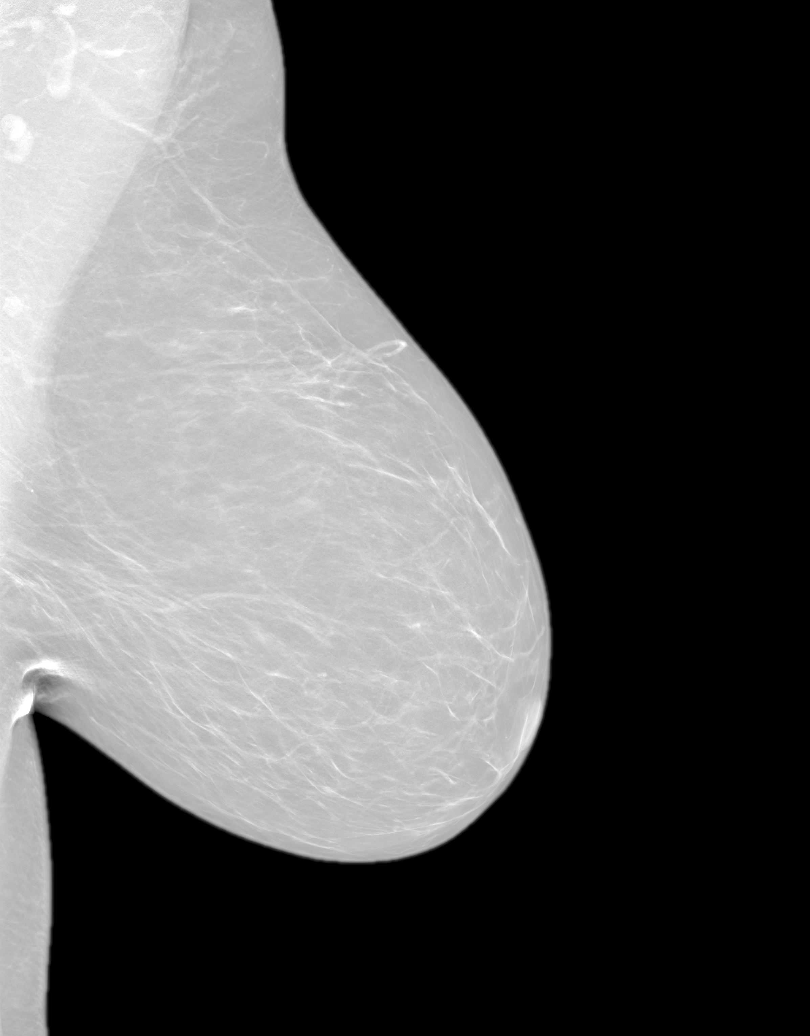

[R]
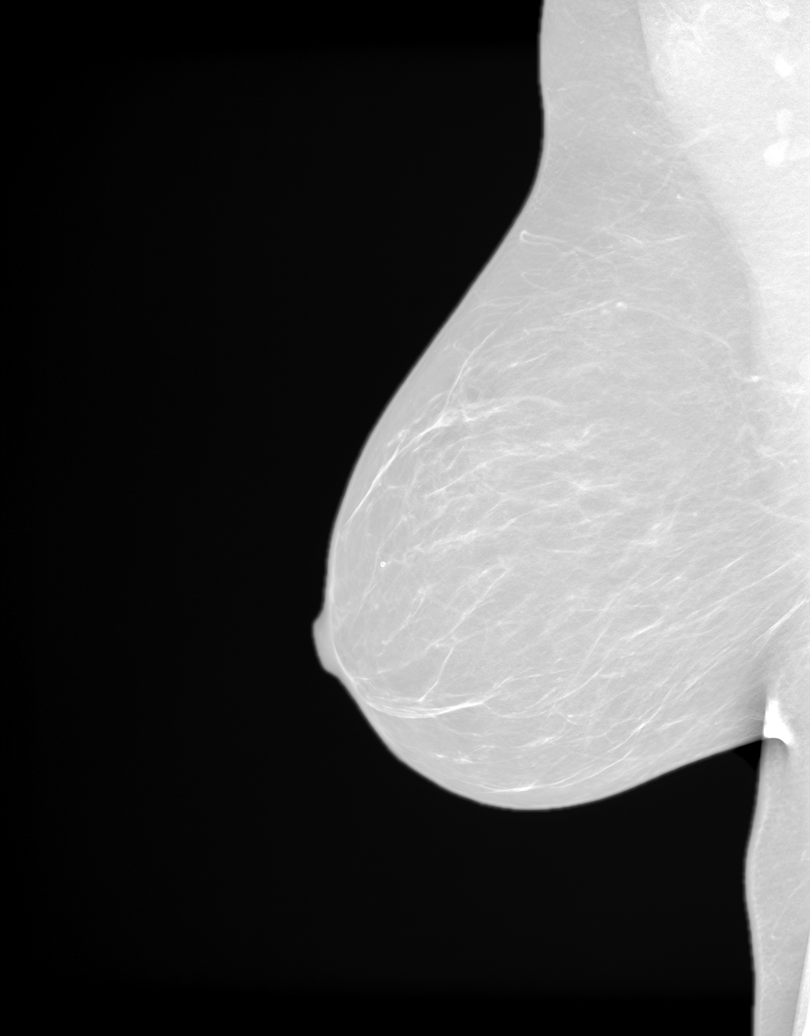

[R CC tomo · right · 0.10mm/px · 2 of 2 slices shown]
[im 1/2]
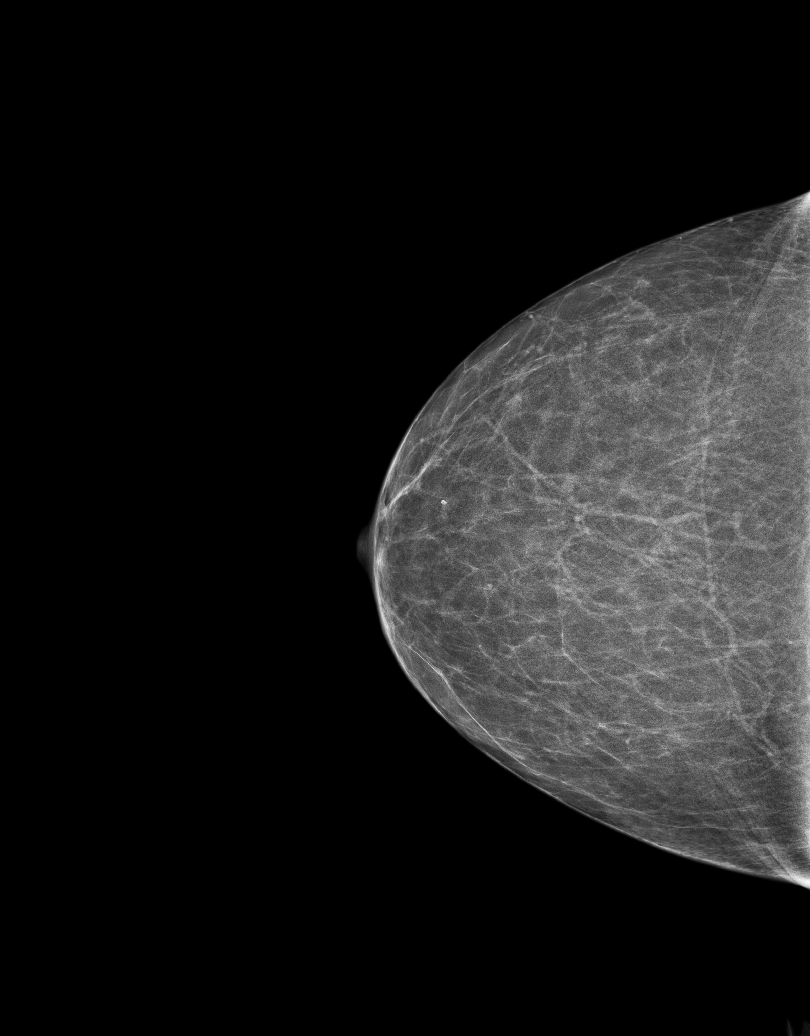
[im 2/2]
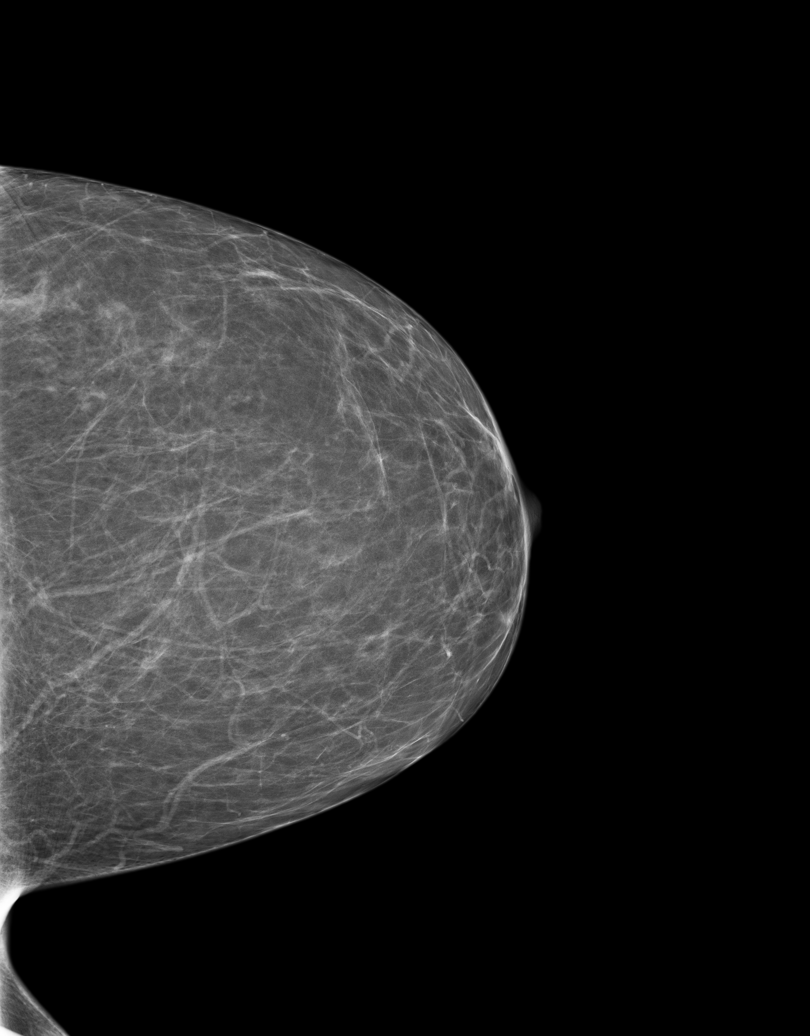

[3D SCREENING MAMMO BIL AND TOMO tomo · 2 acquisitions, 2 frames shown (1 of 2)]
[im 1/2]
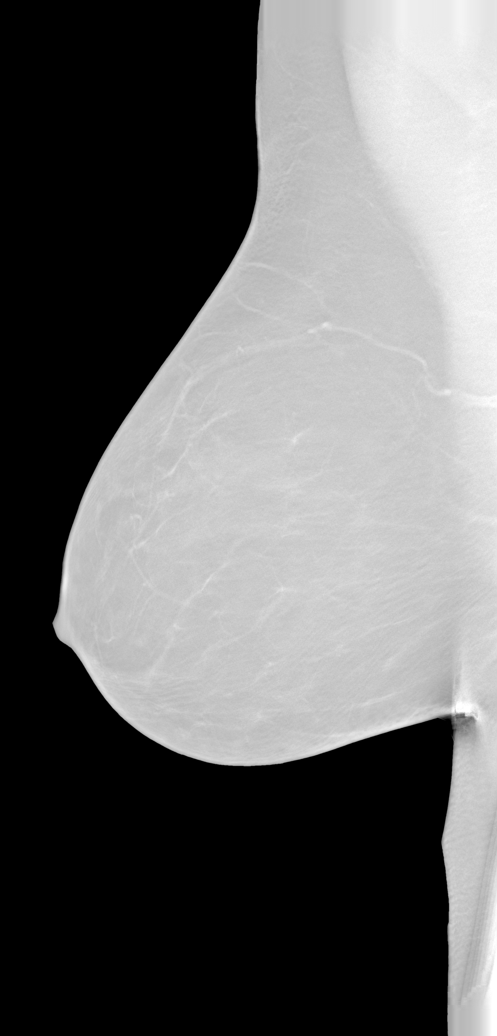
[im 2/2]
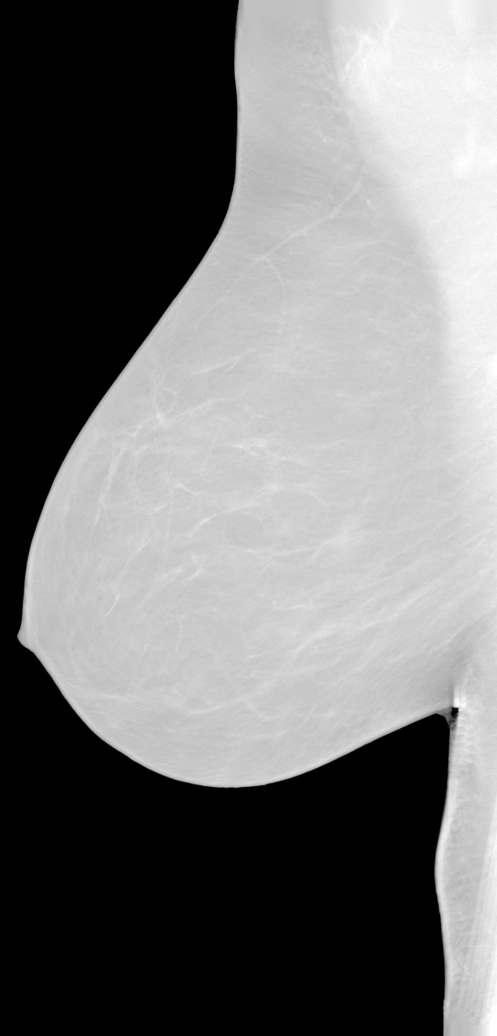

[3D SCREENING MAMMO BIL AND TOMO tomo (2 of 2) · tomo slice 13/81.0]
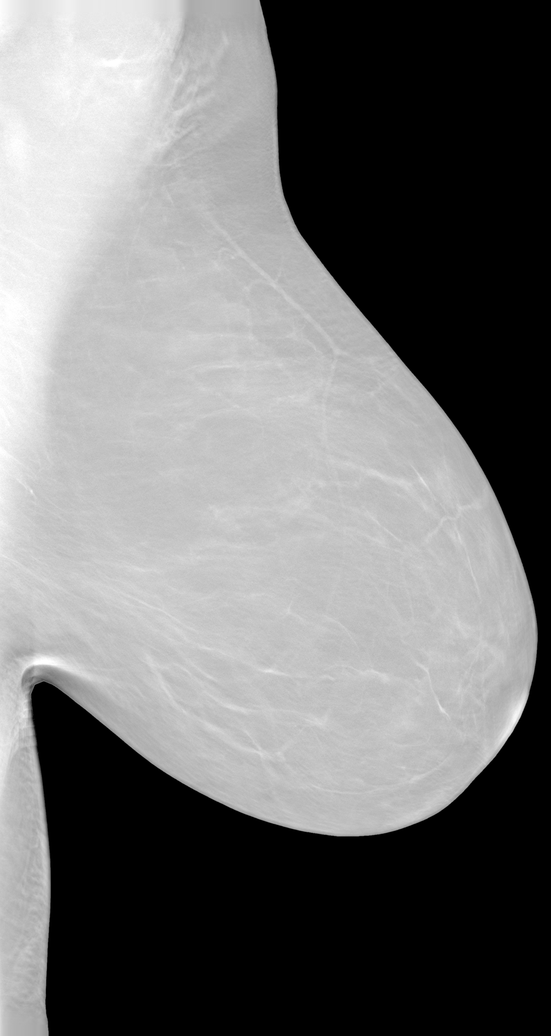

[7 of 24 positions shown; findings below may reference images not displayed]

FINDINGS: There are scattered fibroglandular elements.  There is no mass or suspicious cluster of microcalcifications.   There is no architectural distortion, skin thickening or nipple retraction.
IMPRESSION: 1.  BIRADS 2-Benign findings. Patient has been added in a reminder system with a target date for the next screening mammography.

2.  DENSITY CODE –  B (Scattered areas of fibroglandular density) 

Final Assessment Code:

BI-RADS 0
 Need additional imaging evaluation.

BI-RADS 1
 Negative mammogram.

BI-RADS 2
 Benign finding.

BI-RADS 3
 Probably benign finding; short-interval follow-up suggested.

BI-RADS 4
 Suspicious abnormality; biopsy should be considered.

BI-RADS 5
 Highly suggestive of malignancy; appropriate action should be taken.

BI-RADS 6
 Known biopsy-proven malignancy; appropriate action should be taken.

NOTE:
In compliance with Federal regulations, the results of this mammogram are being sent to the patient.

------------- REPORT [id number] -------------
[facility]
[address]
[place]/MS/TELLIS, LAKSHAY
We wish to report the following on your recent mammography examination. We are sending a report to your referring physician or other health care provider.
FINDING: Normal-no evidence of cancer

This statement is mandated by the [place], Department of Health.
Your examination was performed by one of our technologists, who are registered radiological technologists and also specially certified in mammography:
___
Marzan, Heron (M)

Your mammogram was interpreted by our radiologist.

( 
Apple Martha, M.D.

(Annual Breast Examination by a physician or other health care provider
(Annual Mammography Screening beginning at age 40
(Monthly Breast Self Examination

## 2022-08-30 ENCOUNTER — Encounter (INDEPENDENT_AMBULATORY_CARE_PROVIDER_SITE_OTHER): Payer: Self-pay | Admitting: OBSTETRICS/GYNECOLOGY

## 2023-04-27 IMAGING — MR MRI LUMBAR SPINE WITHOUT CONTRAST
5 of 6 series · 33 of 48 positions shown · IV contrast (gadolinium)
Comparison: Outside x-rays done.

﻿EXAM:  45609   MRI LUMBAR SPINE WITHOUT CONTRAST
INDICATION: Chronic lower back pain over the past few years.  Scoliosis.
TECHNIQUE: Multiplanar multisequential MRI of the lumbar spine was performed without gadolinium contrast.

[Series 11: T2 · sagittal · 5.0mm · 0.94mm/px · 6 of 14 slices shown (1 of 3)]
[im 1/14]
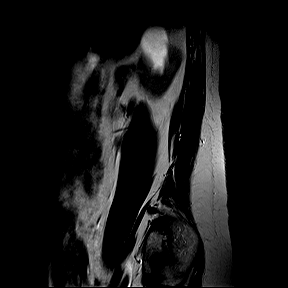
[im 3/14]
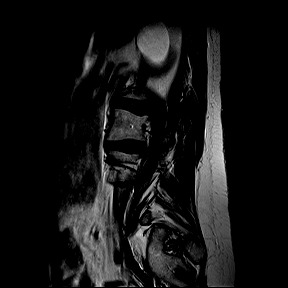
[im 6/14]
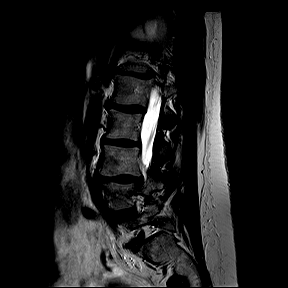
[im 8/14]
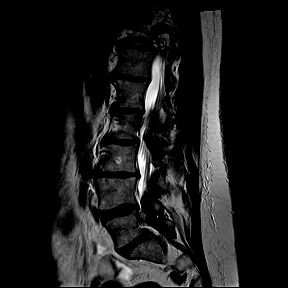
[im 11/14]
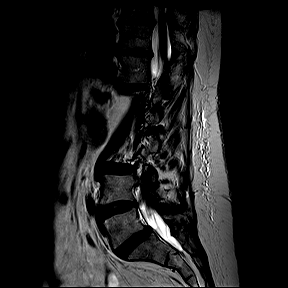
[im 14/14]
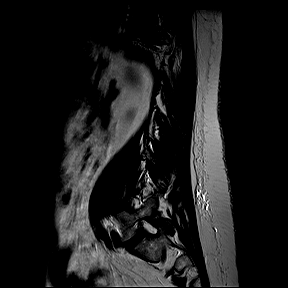

[Series 12: T1 · sagittal · 5.0mm · 0.94mm/px · 7 of 15 slices shown (1 of 2)]
[im 1/15]
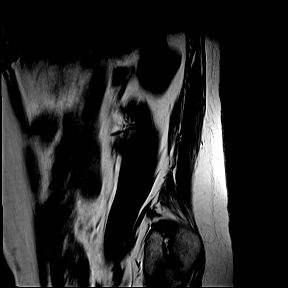
[im 3/15]
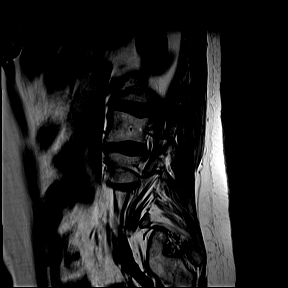
[im 5/15]
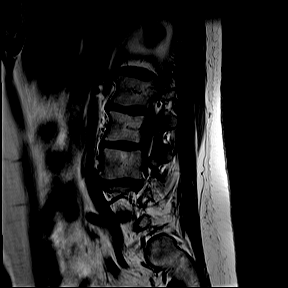
[im 8/15]
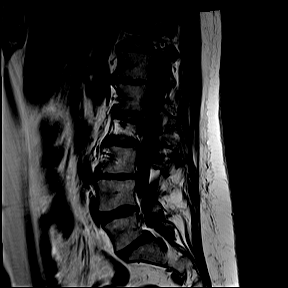
[im 10/15]
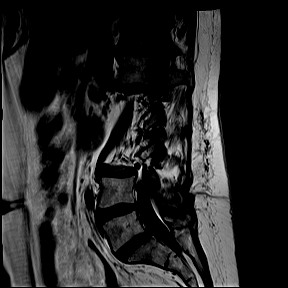
[im 12/15]
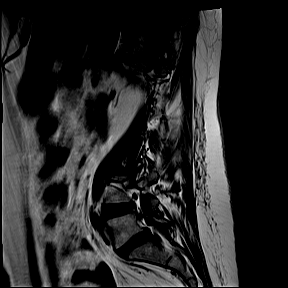
[im 15/15]
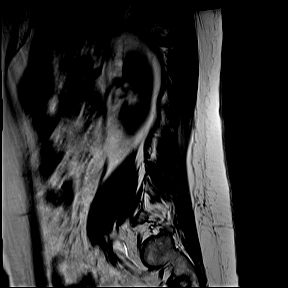

[Series 14: T2 · axial · 4.0mm · 0.52mm/px · z∈[-98,+154]mm · 8 of 24 slices shown (2 of 3)]
[im 1/24]
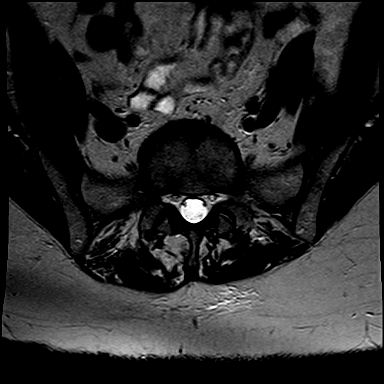
[im 3/24]
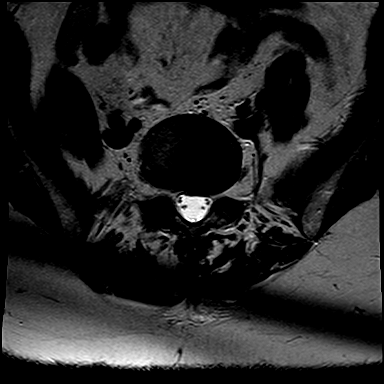
[im 8/24]
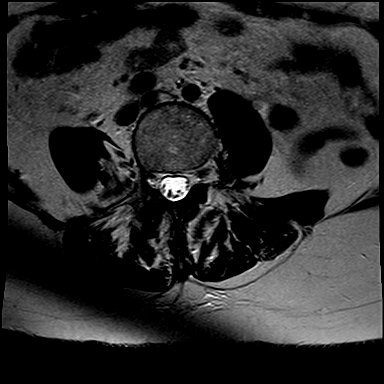
[im 11/24]
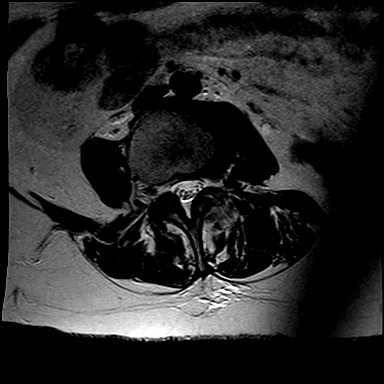
[im 13/24]
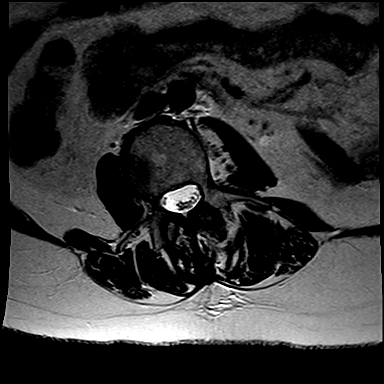
[im 16/24]
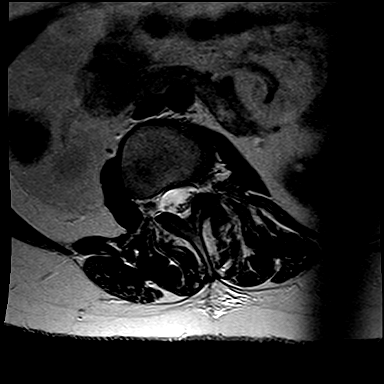
[im 21/24]
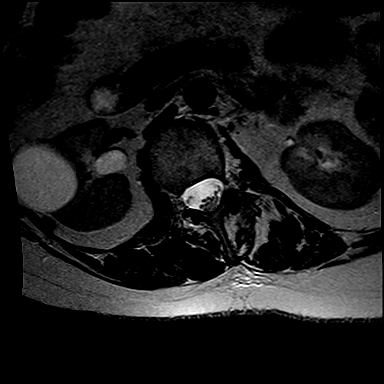
[im 24/24]
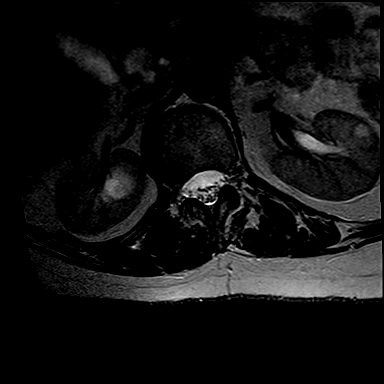

[Series 15: T1 · axial · 4.0mm · 0.52mm/px · z∈[-98,+4]mm · 4 of 24 slices shown (2 of 2)]
[im 1/24]
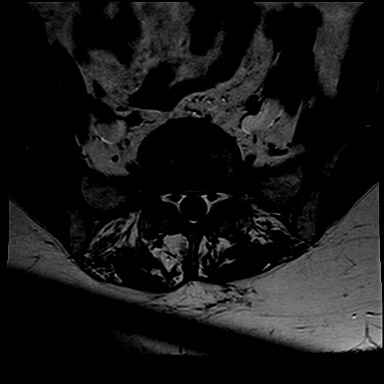
[im 3/24]
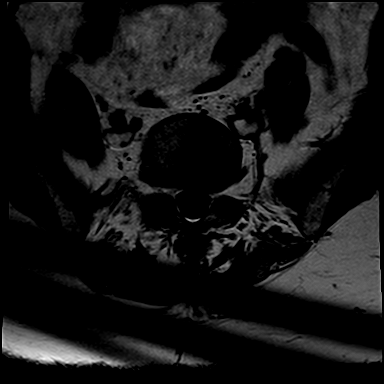
[im 8/24]
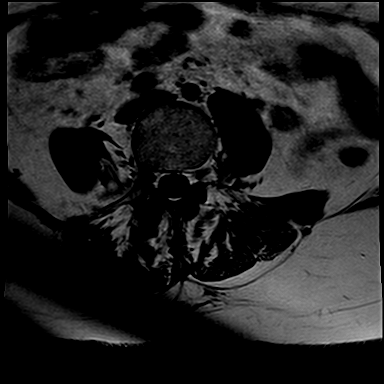
[im 11/24]
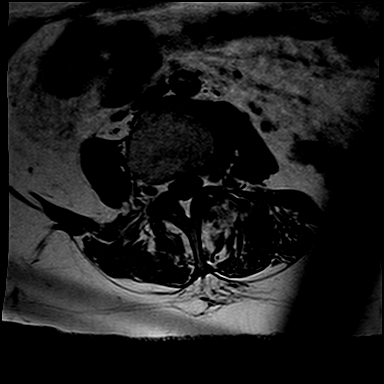

[Series 16: T2 · coronal · 5.5mm · 0.82mm/px · 8 of 18 slices shown (3 of 3)]
[im 1/18]
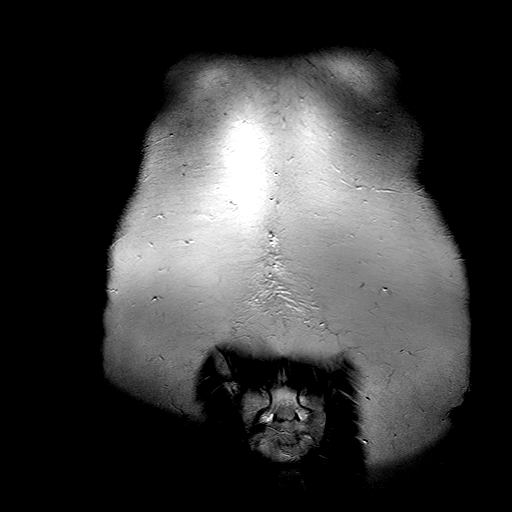
[im 3/18]
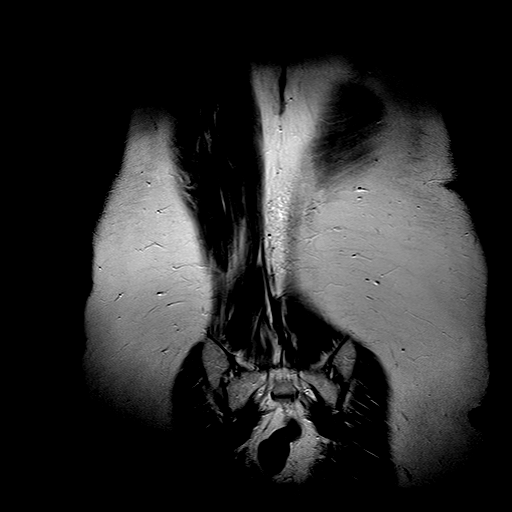
[im 5/18]
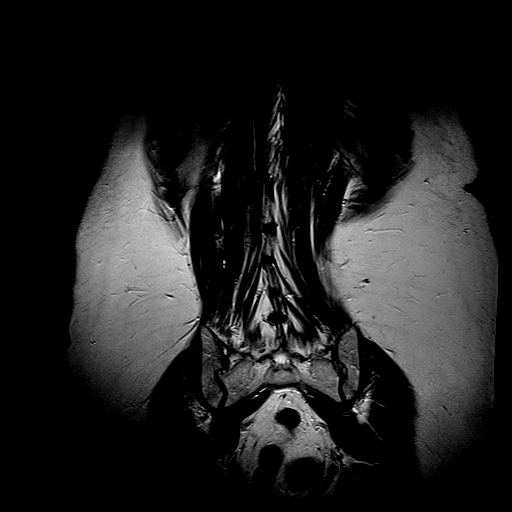
[im 8/18]
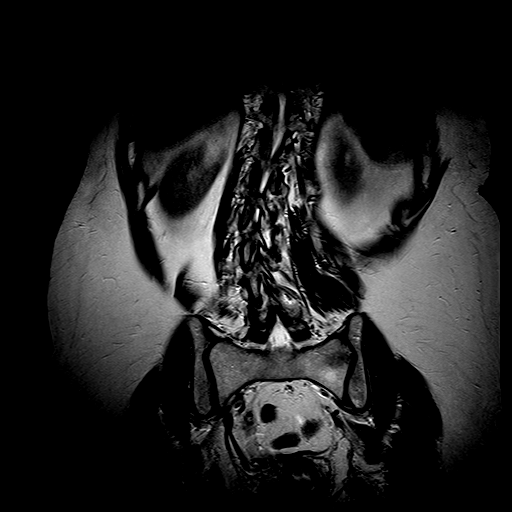
[im 10/18]
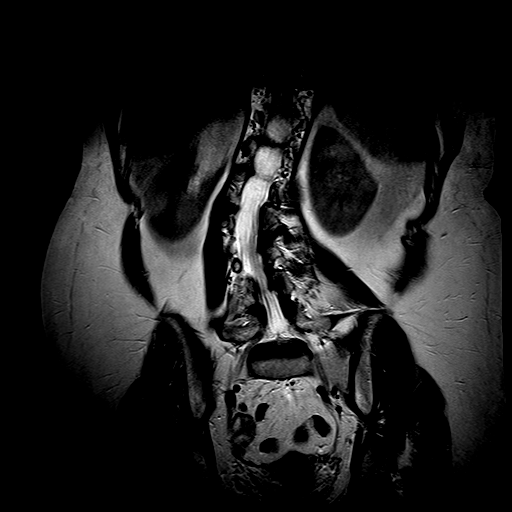
[im 13/18]
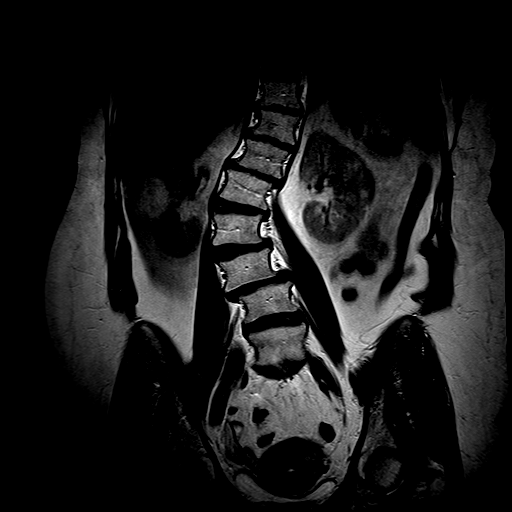
[im 15/18]
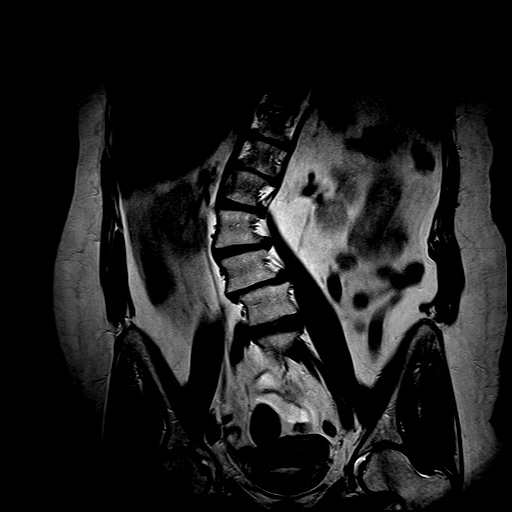
[im 18/18]
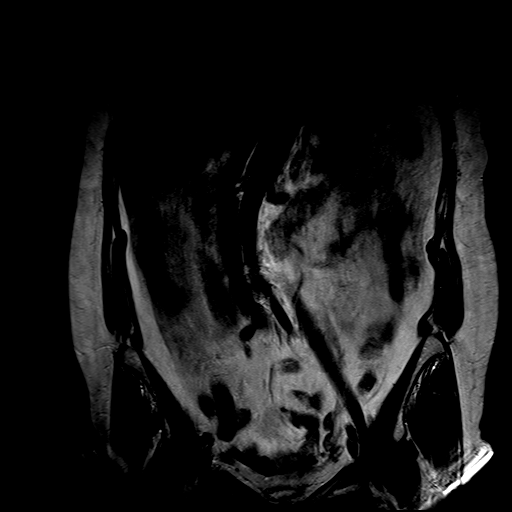

[33 of 48 positions shown; findings below may reference images not displayed]

FINDINGS: There is moderate dextroconvex scoliosis.  

Moderate degenerative changes are noted at multiple levels with disc space narrowing, disc desiccation, and small osteophyte formation.  

There is mild multilevel disc bulging.  No disc herniation is seen.  There is mild spinal stenosis at L3-4.
IMPRESSION: 1. Moderate scoliosis.

2. Moderate degenerative changes.  

3. Mild multilevel disc bulging. 

4. Mild L3-4 spinal stenosis.  

5. No fracture or disc herniation seen.

## 2024-02-08 ENCOUNTER — Encounter (HOSPITAL_PSYCHIATRIC): Payer: Self-pay | Admitting: PHYSICIAN ASSISTANT

## 2024-02-08 ENCOUNTER — Ambulatory Visit: Payer: Self-pay | Attending: PHYSICIAN ASSISTANT | Admitting: PHYSICIAN ASSISTANT

## 2024-02-08 ENCOUNTER — Other Ambulatory Visit: Payer: Self-pay

## 2024-02-08 VITALS — BP 133/79 | HR 94 | Resp 18 | Ht 66.0 in | Wt 160.0 lb

## 2024-02-08 DIAGNOSIS — F321 Major depressive disorder, single episode, moderate: Secondary | ICD-10-CM | POA: Insufficient documentation

## 2024-02-08 DIAGNOSIS — F322 Major depressive disorder, single episode, severe without psychotic features: Secondary | ICD-10-CM | POA: Insufficient documentation

## 2024-02-08 DIAGNOSIS — F329 Major depressive disorder, single episode, unspecified: Secondary | ICD-10-CM | POA: Insufficient documentation

## 2024-02-08 DIAGNOSIS — Z634 Disappearance and death of family member: Secondary | ICD-10-CM | POA: Insufficient documentation

## 2024-02-08 DIAGNOSIS — F4310 Post-traumatic stress disorder, unspecified: Secondary | ICD-10-CM | POA: Insufficient documentation

## 2024-02-08 DIAGNOSIS — F411 Generalized anxiety disorder: Secondary | ICD-10-CM | POA: Insufficient documentation

## 2024-02-08 MED ORDER — TRAZODONE 50 MG TABLET
ORAL_TABLET | ORAL | 1 refills | Status: DC
Start: 2024-02-08 — End: 2024-03-21

## 2024-02-08 MED ORDER — BUSPIRONE 5 MG TABLET
5.0000 mg | ORAL_TABLET | Freq: Two times a day (BID) | ORAL | 1 refills | Status: DC
Start: 2024-02-08 — End: 2024-03-21

## 2024-02-08 MED ORDER — BUPROPION HCL XL 300 MG 24 HR TABLET, EXTENDED RELEASE
300.0000 mg | ORAL_TABLET | Freq: Every day | ORAL | 1 refills | Status: DC
Start: 2024-02-08 — End: 2024-03-21

## 2024-02-08 NOTE — Patient Instructions (Signed)
 Take medications as prescribed, avoid drugs and alcohol, call office if symptoms worsen or problems arise.  [phone].

## 2024-02-08 NOTE — Progress Notes (Signed)
 Psych Evaluation-Outpatient  Leah Mendez  Date of Birth:  1966/01/12  Date of Service:  02/08/2024    Chief Complaint: Anxiety Depression.  YEP:Leah Mendez is a 58 y.o. female who comes in today for depression and anxiety.  She has work, all the family/home things, and her own issues to deal with. She feels that she is forced to work because her husband left her. She tries to enjoy life, but at the end of the day it is the patient at her house alone. She is a Public affairs consultant by personality and spends energy on everyone but herself. Her mother lives by herself and is 90 years old. She drains the patient and her sister. She worries to death if the patient doesn't answer the phone. They only live ten miles away from each other.  Her mother's worries, become the patient's worries.  She has two outside and two inside cats, they bring her joy and are good company.  She struggles and becomes so overwhelmed. She feels as if she has everything on her plate. She worries about her ability to work, what if she dies alone in the house, and trying to figure everything out.  She has a wonderful support team. She does experience sadness, but no helplessness, hopelessness, or worthlessness. She does experience some anhedonia, but not all the time because she gets out and does things with others.  She has difficulty staying asleep even with the CPAP. She denies nightmares.   She has the trauma of finding her husband dead. She re-experiences, flashbacks, and has the triggers of being in her house. She is not hypervigilant or have an exaggerated startle response. She is feels better when her sister and her husband are in the holler. She will not think about being by herself.  She has mild panic attacks. She tries not to think and keep her guard up on her smotions. Some days she just breaks.  She tries not to talk about things, but handle it on her own.   She worries excessively and has difficulty controlling the worry. She feels a sense of  impending doom.   Past psych history: Wellbutrin  written by her PCP.   Past medical history: Scoliosis, Sleep Apnea with CPAP.  Past Medical History:   Diagnosis Date    Essential hypertension       Physical Exam/ ROS: Back pain-rated at a 5/0.  Family Psychiatric History: Sister.  Family Medical History: Sleep Apnea, Heart Disease (heart attack at 48, open heart surgery at 78 or 79 years). He passed in his sleep and she feels it was a sudden thing and feels so unprepared.   Social History:  She was born and raised in [place], [place] by both parents.  She had a great childhood with good parents. She has one older sister.  She was active in band, had many friends in school. She was an average student, and her favorite subject was math.  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 She started dating her husband and his father had a book-keeping office in [place] and she landed a job. She worked until she was pregnant with her daughter, now 15 years old. 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 He is the love of her life, but doesn't get to see him very often. Her husband was a Psychologist, occupational and would  take on odd jobs, so that she could be a stay-at-home mom. She has a son, age 34 years old, who lives in [place]. He is a case Production designer, theatre/television/film at [employer] and has an English degree and trying to find his way in life. His wife is a Child psychotherapist. He worked for [employer] for [employer], then took a commuting job to [employer] for [employer]. He would like to eventually get his Master's. She went to work for the [employer] in 2006.  She is a 100 percent beach person. She went to [facility] in May. She and her best friend are leaving on Saturday to [facility] and have gone together every year since 2013.  She enjoys wintertime, but doesn't like to drive in it. They go to [facility] for a few days. With she  and her sister both work at [employer], so they aren't often able to take off together. She was out in March for 3 weeks, due to anxiety and blood pressure. She just found out last summer that she has Scoliosis. She has been going to doctors for that. 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 She started going to a pain clinic and they have started a round of injections on July 11th and has to go back next month. They have also talked about doing an ablation. The working conditions are not ideal, she finds her co-workers Production assistant, radio. 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 She went to the doctor and her blood pressure was up and she was crying uncontrollably. She had to get the union involved. She has been  on FMLA for her back and anxiety She has been very overwhelmed with the situation and dreads going to work.  She has more bad days then good days. She worries about her future. She lost her husband, three and a half years ago in February, and it was unexpected. He was getting ready for work and dropped dead in the bathroom.  It was unexpected and hurt for her kids. People try to put a timeline on grief and tell her to just move on. She and her husband dated for two years and were married for 31 years. She doesn't even want another man. Every morning, her feet hit the floor and she thinks of that morning and goes into that bathroom and sees her husband laid up against the door. She remembers all the details, even now. She heard him hit the wall, the pictures on the wall rattled, she heard all the air leave his body with a moan and she didn't grasp what was going on. She called her sister and her husband came up, as well as her best friend who is her rock. The people that were important to her were there with her. She worries that maybe she could have pushed her way into the bathroom and saved him. The EMS worked on him in the living room for what seemed like forever. They came to her and told her there was  nothing they could do. She watched them zip him in a body bag and took him out of the house. She had to fight the [employer] and the [place] for benefits and an income. She was successful in the end and did get a piece of what was owed him. She relies on her faith as a source of strength.  Her daughter tried to force the COVID vaccine on all of them, and used  the reasoning that if her father had taken the booster he would have lived.  He had seen their local family doctor on a 2024/02/27 and he passed away the Feb 29, 2024 afterward. Her husband never told her about the doctor's visits or the testing. The doctor told her that her husband's heart was the worst he had ever seen in a 58 year old man. They had a service in the gym where people just came in and talked about her husband. She had him cremated, because that is what the kids wanted, because they didn't want to see him in a casket. She still has his ashes on her fireplace.   PHQ-9: 14.  PDMP  Reviewed.  MSE: The patient is alert and oriented x4, casually dressed, fair eye contact, disheveled a bit, appearing stated age.  Speech is normal rate and tone.  Patient is somewhat talkative and appears depressed. There is no flight of ideas, loosening of associations, or tangential speech.  Not manic.  Mood is okay.  Affect congruent.  Patient does not appear to be in any acute physical distress.  No overt suicidal ideations, no homicidal ideation.  No auditory or visual hallucinations, no delusions, no paranoia.  No signs of psychosis.  no plans to harm self, no plans to harm others.  Patient is not aggressive or threatening.  No psychomotor agitation.  No psychomotor retardation.  No abnormal involuntary movements.Thoughts are linear, logical, and goal directed.  Intellectual functioning is good.  Memory is intact to recent, remote, and past events.  Patient can recall 3 of 3 objects at 0 and 5 minutes, and what was eaten for last meal.  Patient is able to provide details of current  situation.  Patient can name the president, vice president, and governor.  Language is good.  Vocabulary is unimpaired, no word finding difficulty or word misuse.  Intelligence is good, patient can interpret a proverb, and reports apple and orange similarity. Calculation is unimpaired.  Concentration is good, able to recite days of week forward and backward.  Insight is good; patient is aware of their illness, how it affects their functioning, and what needs to happen for future improvement.  Judgment is good; patient is compliant with treatment and can relate appropriately to what they would do if smelling smoke in a theater, or finding stamped addressed envelope.    Diagnoses:  Major Depressive Disorder-single episode, moderate, Bereavement, PTSD.    Plan:   Continue Wellbutrin  XL 300 mg daily for mood.  Start Buspar  5 mg bid for anxiety/mood.  Start Trazodone  25-50 mg qhs/prn for sleep.  Return to clinic in 4 weeks.     Adrien Herd, PA-C

## 2024-03-07 ENCOUNTER — Ambulatory Visit (HOSPITAL_PSYCHIATRIC): Payer: Self-pay | Admitting: PHYSICIAN ASSISTANT

## 2024-03-15 ENCOUNTER — Ambulatory Visit: Payer: Self-pay | Admitting: PHYSICIAN ASSISTANT

## 2024-03-21 ENCOUNTER — Other Ambulatory Visit: Payer: Self-pay

## 2024-03-21 ENCOUNTER — Encounter (HOSPITAL_PSYCHIATRIC): Payer: Self-pay | Admitting: PHYSICIAN ASSISTANT

## 2024-03-21 ENCOUNTER — Ambulatory Visit: Attending: PHYSICIAN ASSISTANT | Admitting: PHYSICIAN ASSISTANT

## 2024-03-21 VITALS — BP 118/75 | HR 92 | Resp 18 | Ht 66.0 in | Wt 160.0 lb

## 2024-03-21 DIAGNOSIS — F329 Major depressive disorder, single episode, unspecified: Secondary | ICD-10-CM

## 2024-03-21 DIAGNOSIS — F321 Major depressive disorder, single episode, moderate: Secondary | ICD-10-CM | POA: Insufficient documentation

## 2024-03-21 DIAGNOSIS — Z79899 Other long term (current) drug therapy: Secondary | ICD-10-CM

## 2024-03-21 DIAGNOSIS — F4310 Post-traumatic stress disorder, unspecified: Secondary | ICD-10-CM | POA: Insufficient documentation

## 2024-03-21 DIAGNOSIS — F411 Generalized anxiety disorder: Secondary | ICD-10-CM | POA: Insufficient documentation

## 2024-03-21 DIAGNOSIS — Z634 Disappearance and death of family member: Secondary | ICD-10-CM | POA: Insufficient documentation

## 2024-03-21 MED ORDER — TRAZODONE 50 MG TABLET
ORAL_TABLET | ORAL | 1 refills | Status: AC
Start: 2024-03-21 — End: ?

## 2024-03-21 MED ORDER — BUPROPION HCL XL 300 MG 24 HR TABLET, EXTENDED RELEASE: 300.0000 mg | ORAL_TABLET | Freq: Every day | ORAL | 1 refills | Status: DC

## 2024-03-21 MED ORDER — BUSPIRONE 5 MG TABLET: 5.0000 mg | ORAL_TABLET | Freq: Two times a day (BID) | ORAL | 1 refills | Status: DC

## 2024-03-21 NOTE — Progress Notes (Signed)
 BEHAVIORAL MEDICINE, [facility]  [address]  [place] [address]  Operated by [facility]  Progress Note    Name: MELESA LECY MRN:  [id number]   Date: 03/21/2024 DOB:  12-05-1965 (58 y.o.)           Chief Complaint: PTSD, Major Depression, and Generalized Anxiety (Bereavement)    Subjective:   Patient reports that she had to take her cat to be neutered today. She is going back to the beach October 12th with her two best friends from high school for a four day trip. This weekend is her grandson's 3rd birthday party and she is will be down there all weekend. She is having an ablation on her back on September 23rd. 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   Mood: better.  Medications: She feels that her medications have kicked in and can tell a difference.   Appetite: She is eating well with a good appetite.  Sleep:: She hasn't taken the medication too often. They do help a smidge when she really needs it and when she takes an entire tablet. She has been up since 5:30am.   Energy:Limited due to her back issues.  Stressors: Her father's 8th death anniversary is coming up at the end of September. October 27th would have been her 35th wedding anniversary. The holidays can be difficult, but she has been blessed with good children, a sister, and her mother.     Problem List[1]  Past Medical History[2]  Past Surgical History[3]  Family History[4]  Social History     Socioeconomic History    Marital status: Married   Tobacco Use    Smoking status: Never    Smokeless tobacco: Never   Vaping Use    Vaping status: Every Day    Substances: Nicotine    Devices: Disposable   Substance and Sexual Activity    Alcohol use: Never    Drug use: Never    Sexual activity: Not Currently      Patient has no known allergies.   Current Outpatient Medications   Medication Sig    buPROPion  (WELLBUTRIN  XL) 300 mg extended release 24 hr tablet Take 1  Tablet (300 mg total) by mouth Daily    busPIRone  (BUSPAR ) 5 mg Oral Tablet Take 1 Tablet (5 mg total) by mouth Twice daily    clotrimazole -betamethasone  (LOTRISONE ) 1-0.05 % Lotion Apply topically Twice daily (Patient not taking: Reported on 02/08/2024)    diclofenac sodium (VOLTAREN) 75 mg Oral Tablet, Delayed Release (E.C.) Take 1 Tablet (75 mg total) by mouth Twice daily    esomeprazole magnesium (NEXIUM) 20 mg Oral Capsule, Delayed Release(E.C.) Take 1 Capsule (20 mg total) by mouth Every morning before breakfast (Patient not taking: Reported on 02/08/2024)    estradioL  (ESTRACE ) 1 mg Oral Tablet Take 1 Tablet (1 mg total) by mouth Once a day (Patient not taking: Reported on 02/08/2024)    levoFLOXacin (LEVAQUIN) 500 mg Oral Tablet Take 1 Tablet (500 mg total) by mouth Once a day (Patient not taking: Reported on 02/08/2024)    losartan (COZAAR) 25 mg Oral Tablet Take 1 Tablet (25 mg total) by mouth Once a day    pantoprazole (PROTONIX) 40 mg Oral Tablet, Delayed Release (E.C.) Take 1 Tablet (40 mg total) by mouth Daily    progesterone  micronized (PROMETRIUM ) 100 mg Oral Capsule Take 1 Capsule (100 mg total) by mouth Once a day (Patient  not taking: Reported on 02/08/2024)    simvastatin (ZOCOR) 20 mg Oral Tablet Take 1 Tablet (20 mg total) by mouth Once a day    traZODone  (DESYREL ) 50 mg Oral Tablet Take one half to a whole tablet as needed for sleep.      Objective :  BP 118/75 (Site: Left Arm, Patient Position: Sitting)   Pulse 92   Resp 18   Ht 1.676 m (5' 6)   Wt 72.6 kg (160 lb)   BMI 25.82 kg/m       PHQ Total Score  PHQ 2 Total: 1  PHQ 9 Total: 5  Interpretation of Total Score: 5-9 Mild depression             02/08/2024     1:42 PM 03/21/2024    10:50 AM   Most Recent PHQ-9 Scores   PHQ 9 Total 14 5         Mental Status Exam  AXOX4. Casual dress, calm, well-groomed. No SI, HI, AVH, delusions, or paranoia. Thoughts are logical, coherent, and goal-directed. Good eye contact. Speech is normal in rate and  tone. Mood is okay affect congruent. No psychomotor agitation or retardation, cogwheel rigidity, or abnormal movements. Gait is normal. Attention, concentration, and memory are good. No cognitive deficits noted. Judgment and insight are fair. Calculation and abstraction are within normal limits.     Data Reviewed  I have reviewed patient's previous note medical, surgical, family, and social history in detail today,     Assessment  PTSD, Major Depression, and Generalized Anxiety (Bereavement)    Plan  Continue the following medications:  Wellbutrin  XL 300 mg daily for mood.  Buspar  5 mg bid for anxiety/mood.  Trazodone  25-50 mg qhs/prn for sleep.      Follow up  Return to clinic in 3 months.    Adrien Herd, PA-C        [1]   Patient Active Problem List  Diagnosis    Screening mammogram, encounter for    Hormone replacement therapy    Vaginal discharge    Bereavement    PTSD (post-traumatic stress disorder)    Current moderate episode of major depressive disorder without prior episode (CMS HCC)    GAD (generalized anxiety disorder)   [2]   Past Medical History:  Diagnosis Date    Essential hypertension    [3]   Past Surgical History:  Procedure Laterality Date    HX DILATION AND CURETTAGE      HX TONSILLECTOMY     [4]   Family History  Problem Relation Name Age of Onset    Sleep apnea Other      Hypertension (High Blood Pressure) Other

## 2024-03-21 NOTE — Patient Instructions (Signed)
 Take medications as prescribed, avoid drugs and alcohol, call office if symptoms worsen or problems arise.  [phone].

## 2024-03-21 NOTE — Addendum Note (Signed)
 Addended by: SYBIL LEW on: 03/21/2024 11:32 AM     Modules accepted: Orders

## 2024-06-20 ENCOUNTER — Ambulatory Visit: Payer: Self-pay | Attending: PHYSICIAN ASSISTANT | Admitting: PHYSICIAN ASSISTANT

## 2024-06-20 ENCOUNTER — Other Ambulatory Visit: Payer: Self-pay

## 2024-06-20 ENCOUNTER — Encounter (HOSPITAL_PSYCHIATRIC): Payer: Self-pay | Admitting: PHYSICIAN ASSISTANT

## 2024-06-20 VITALS — Ht 66.0 in | Wt 160.0 lb

## 2024-06-20 DIAGNOSIS — Z634 Disappearance and death of family member: Secondary | ICD-10-CM

## 2024-06-20 DIAGNOSIS — F4310 Post-traumatic stress disorder, unspecified: Secondary | ICD-10-CM

## 2024-06-20 DIAGNOSIS — F329 Major depressive disorder, single episode, unspecified: Secondary | ICD-10-CM

## 2024-06-20 DIAGNOSIS — F411 Generalized anxiety disorder: Secondary | ICD-10-CM

## 2024-06-20 DIAGNOSIS — F321 Major depressive disorder, single episode, moderate: Secondary | ICD-10-CM

## 2024-06-20 MED ORDER — BUPROPION HCL XL 300 MG 24 HR TABLET, EXTENDED RELEASE: 300.0000 mg | ORAL_TABLET | Freq: Every day | ORAL | 1 refills | Status: AC

## 2024-06-20 MED ORDER — BUSPIRONE 5 MG TABLET: 5.0000 mg | ORAL_TABLET | Freq: Two times a day (BID) | ORAL | 1 refills | Status: AC

## 2024-06-20 NOTE — Patient Instructions (Signed)
 Take medications as prescribed, avoid drugs and alcohol, call office if symptoms worsen or problems arise.  [phone].

## 2024-06-20 NOTE — Progress Notes (Signed)
 TELEMEDICINE DOCUMENTATION:    Patient Location:  MyChart video visit from home address: [address]  [place] [address]    Patient/family aware of provider location:  yes  Patient/family consent for telemedicine:  yes  Examination observed and performed by:  Sybil Lew PA-C

## 2024-06-20 NOTE — Progress Notes (Signed)
[facility]AV[facility]IC[facility], [facility]  [address]  [place] [address]  Operated by [facility]  Progress Note    Name: Leah Mendez MRN:  [id number]   Date: 06/20/2024 DOB:  04/23/1966 (58 y.o.)           Chief Complaint: Major Depression, GAD, Bereavement,  and PTSD    Subjective:   Patient reports that she would rather be at the beach. Her last beach trip with her girlfriends was good. She is looking forward to another one in June or July.  She works for the [employer], so is not afraid of the weather. Christmas and the holidays are always difficult for her. She is okay when she is with her family, but during Christmas her husband would always take off at Thanksgiving and he would put up all of her decorations. She,  her mother, her sister and her husband as well as the kids rented a cabin at [place] and had Thanksgiving there. They all had a good time. She had to have an ablation on her back and an injection in her SI joint. It's her job that exacerbates her condition. She is trying to figure out her future. She hits and misses getting along with her supervisor. Her job is mentally and physically stressful most days. They are doing Christmas on the 27th, so she has time to get her decorations up and gifts bought. They have a parade every year in honor of her husband, as he was loved in the community and was a member of the warden/ranger. She and her children are ride in the parade. It was last week and she reports it was an emotional time for her.   Mood: okay.  Medications: They are working and helping. She can tell a huge difference. She doesn't cry as easily now. She has more emotional control.  Appetite: She is eating , probably too much.  Sleep: She doesn't take the sleep aid very often because it makes her dizzy or something, she does use it as a fall-back. Discussed taking a quarter tablet. She has a CPAP and struggles with it.   Energy: Limited due to back  pain. She has gotten about 30% difference with the ablation and injection. She is on FMLA and is taking a few days next week.   Stressors: The holidays, work, physical pain. Upcoming anniversary of her husband's death in February.    Problem List[1]  Past Medical History[2]  Past Surgical History[3]  Family History[4]  Social History     Socioeconomic History    Marital status: Married   Tobacco Use    Smoking status: Never    Smokeless tobacco: Never   Vaping Use    Vaping status: Every Day    Substances: Nicotine    Devices: Disposable   Substance and Sexual Activity    Alcohol use: Never    Drug use: Never    Sexual activity: Not Currently      Patient has no known allergies.   Current Outpatient Medications   Medication Sig    buPROPion  (WELLBUTRIN  XL) 300 mg extended release 24 hr tablet Take 1 Tablet (300 mg total) by mouth Daily    busPIRone  (BUSPAR ) 5 mg Oral Tablet Take 1 Tablet (5 mg total) by mouth Twice daily    clotrimazole -betamethasone  (LOTRISONE ) 1-0.05 % Lotion Apply topically Twice daily (Patient not taking: Reported on 02/08/2024)    diclofenac sodium (VOLTAREN) 75 mg Oral Tablet, Delayed Release (E.C.)  Take 1 Tablet (75 mg total) by mouth Twice daily    esomeprazole magnesium (NEXIUM) 20 mg Oral Capsule, Delayed Release(E.C.) Take 1 Capsule (20 mg total) by mouth Every morning before breakfast (Patient not taking: Reported on 02/08/2024)    estradioL  (ESTRACE ) 1 mg Oral Tablet Take 1 Tablet (1 mg total) by mouth Once a day (Patient not taking: Reported on 02/08/2024)    levoFLOXacin (LEVAQUIN) 500 mg Oral Tablet Take 1 Tablet (500 mg total) by mouth Once a day (Patient not taking: Reported on 02/08/2024)    losartan (COZAAR) 25 mg Oral Tablet Take 1 Tablet (25 mg total) by mouth Once a day    pantoprazole (PROTONIX) 40 mg Oral Tablet, Delayed Release (E.C.) Take 1 Tablet (40 mg total) by mouth Daily    progesterone  micronized (PROMETRIUM ) 100 mg Oral Capsule Take 1 Capsule (100 mg total) by mouth  Once a day (Patient not taking: Reported on 02/08/2024)    simvastatin (ZOCOR) 20 mg Oral Tablet Take 1 Tablet (20 mg total) by mouth Once a day    traZODone  (DESYREL ) 50 mg Oral Tablet Take one half to a whole tablet as needed for sleep.      Objective :  Ht 1.676 m (5' 6)   Wt 72.6 kg (160 lb)   BMI 25.82 kg/m       PHQ Total Score  PHQ 2 Total: 1  PHQ 9 Total: 7  Interpretation of Total Score: 5-9 Mild depression             02/08/2024     1:42 PM 03/21/2024    10:50 AM 06/20/2024    11:53 AM   Most Recent PHQ-9 Scores   PHQ 9 Total 14 5 7          Mental Status Exam  AXOX4. Casual dress, calm, well-groomed. No SI, HI, AVH, delusions, or paranoia. Thoughts are logical, coherent, and goal-directed. Good eye contact. Speech is normal in rate and tone. Mood is okay affect congruent. No psychomotor agitation or retardation, cogwheel rigidity, or abnormal movements. Gait is normal. Attention, concentration, and memory are good. No cognitive deficits noted. Judgment and insight are fair. Calculation and abstraction are within normal limits.     Data Reviewed  I have reviewed patient's previous note medical, surgical, family, and social history in detail today,     Assessment  Major Depression, GAD, Bereavement,  and PTSD    Plan  Continue the following medications:  Wellbutrin  XL 300 mg daily for mood.  Buspar  5 mg bid for anxiety/mood.  Trazodone  12.5-25 mg qhs/prn for sleep.      Follow up  Return to clinic in 3 months.    Adrien Herd, PA-C        [1]   Patient Active Problem List  Diagnosis    Screening mammogram, encounter for    Hormone replacement therapy    Vaginal discharge    Bereavement    PTSD (post-traumatic stress disorder)    Current moderate episode of major depressive disorder without prior episode (CMS HCC)    GAD (generalized anxiety disorder)   [2]   Past Medical History:  Diagnosis Date    Essential hypertension    [3]   Past Surgical History:  Procedure Laterality Date    HX DILATION AND CURETTAGE       HX TONSILLECTOMY     [4]   Family History  Problem Relation Name Age of Onset    Sleep apnea Other  Hypertension (High Blood Pressure) Other

## 2024-09-19 ENCOUNTER — Ambulatory Visit (HOSPITAL_PSYCHIATRIC): Payer: Self-pay | Admitting: PHYSICIAN ASSISTANT
